# Patient Record
Sex: Male | Born: 1990
Health system: Southern US, Community
[De-identification: ages and names within clinical notes are randomized; demographics above are authoritative.]

## PROBLEM LIST (undated history)

## (undated) DIAGNOSIS — R519 Headache, unspecified: Secondary | ICD-10-CM

## (undated) DIAGNOSIS — I10 Essential (primary) hypertension: Secondary | ICD-10-CM

## (undated) HISTORY — DX: Headache, unspecified: R51.9

---

## 2010-04-16 ENCOUNTER — Emergency Department (HOSPITAL_BASED_OUTPATIENT_CLINIC_OR_DEPARTMENT_OTHER)
Admission: EM | Admit: 2010-04-16 | Discharge: 2010-04-17 | Payer: Self-pay | Source: Home / Self Care | Admitting: Emergency Medicine

## 2010-07-20 LAB — URINALYSIS, ROUTINE W REFLEX MICROSCOPIC
Bilirubin Urine: NEGATIVE
Glucose, UA: NEGATIVE mg/dL
Ketones, ur: NEGATIVE mg/dL
Nitrite: NEGATIVE
Protein, ur: NEGATIVE mg/dL
Specific Gravity, Urine: 1.02 (ref 1.005–1.030)
Urobilinogen, UA: 1 mg/dL (ref 0.0–1.0)
pH: 6.5 (ref 5.0–8.0)

## 2010-07-20 LAB — URINE MICROSCOPIC-ADD ON

## 2010-07-20 LAB — GC/CHLAMYDIA PROBE AMP, GENITAL
Chlamydia, DNA Probe: NEGATIVE
GC Probe Amp, Genital: POSITIVE — AB

## 2010-12-12 ENCOUNTER — Emergency Department (HOSPITAL_BASED_OUTPATIENT_CLINIC_OR_DEPARTMENT_OTHER)
Admission: EM | Admit: 2010-12-12 | Discharge: 2010-12-12 | Disposition: A | Discharge status: Home / Self Care | Payer: Medicaid Other | Attending: Emergency Medicine | Admitting: Emergency Medicine

## 2010-12-12 ENCOUNTER — Encounter: Payer: Self-pay | Admitting: Emergency Medicine

## 2010-12-12 DIAGNOSIS — L259 Unspecified contact dermatitis, unspecified cause: Secondary | ICD-10-CM | POA: Insufficient documentation

## 2010-12-12 DIAGNOSIS — A64 Unspecified sexually transmitted disease: Secondary | ICD-10-CM

## 2010-12-12 MED ORDER — AZITHROMYCIN 1 G PO PACK
1.0000 g | PACK | Freq: Once | ORAL | Status: AC
  Administered 2010-12-12: 1 g via ORAL
  Filled 2010-12-12: qty 1

## 2010-12-12 MED ORDER — CEFTRIAXONE SODIUM 250 MG IJ SOLR
250.0000 mg | Freq: Once | INTRAMUSCULAR | Status: AC
  Administered 2010-12-12: 250 mg via INTRAMUSCULAR
  Filled 2010-12-12: qty 250

## 2010-12-12 MED ORDER — METHYLPREDNISOLONE 4 MG PO KIT: PACK | ORAL | Status: AC

## 2010-12-12 NOTE — ED Provider Notes (Signed)
History     CSN: [id number] Arrival date & time: 12/12/2010  4:11 PM  Chief Complaint  Patient presents with  . Allergic Reaction  . Exposure to STD   Patient is a 20 y.o. male presenting with STD exposure and rash.  Exposure to STD This is a recurrent problem. Pertinent negatives include no chest pain, no abdominal pain, no headaches and no shortness of breath.  Rash  This is a recurrent problem. The current episode started more than 1 week ago. The problem is associated with nothing. There has been no fever. The rash is present on the left hand, right hand, right foot and left foot. The pain is mild. The pain has been constant since onset. Associated symptoms include blisters and itching. Pertinent negatives include no weeping. He has tried steriods for the symptoms. The treatment provided moderate relief.  patient has a rash on the back of his hands and fingers. It is also on the tops of his feet. He states that it has gotten better with hydrocortiosone. He states that he works with latex gloves on. He thinks that it could be from his gonnorhea that he didn't take his abx for.  Patient states that he was previously treated for an STD, but didn't take the medicines. No penile discharge or burning.  History reviewed. No pertinent past medical history.  History reviewed. No pertinent past surgical history.  No family history on file.  History  Substance Use Topics  . Smoking status: Not on file  . Smokeless tobacco: Not on file  . Alcohol Use: Not on file      Review of Systems  Constitutional: Negative for activity change and appetite change.  HENT: Negative for neck stiffness.   Eyes: Negative for pain.  Respiratory: Negative for chest tightness and shortness of breath.   Cardiovascular: Negative for chest pain and leg swelling.  Gastrointestinal: Negative for nausea, vomiting, abdominal pain and diarrhea.  Genitourinary: Negative for hematuria, flank pain, discharge, genital  sores and penile pain.  Musculoskeletal: Negative for back pain.  Skin: Positive for color change, itching and rash. Negative for wound.  Neurological: Negative for weakness, numbness and headaches.  Psychiatric/Behavioral: Negative for behavioral problems.    Physical Exam  BP 135/76  Pulse 83  Temp(Src) 98.4 F (36.9 C) (Oral)  Resp 16  SpO2 100%  Physical Exam  Nursing note and vitals reviewed. Constitutional: He is oriented to person, place, and time. He appears well-developed and well-nourished.  HENT:  Head: Normocephalic and atraumatic.  Eyes: EOM are normal. Pupils are equal, round, and reactive to light.  Neck: Normal range of motion. Neck supple.  Cardiovascular: Normal rate, regular rhythm and normal heart sounds.   No murmur heard. Pulmonary/Chest: Effort normal and breath sounds normal.  Abdominal: Soft. Bowel sounds are normal. He exhibits no distension and no mass. There is no tenderness. There is no rebound and no guarding.  Genitourinary: Penis normal. No penile tenderness.  Musculoskeletal: Normal range of motion. He exhibits no edema.  Neurological: He is alert and oriented to person, place, and time. No cranial nerve deficit.  Skin:       Raised thickened skin on dorsum of bilateral hands, involving fingers. Some blisters. Few areas on feet also  Psychiatric: He has a normal mood and affect.    ED Course  Procedures  MDM Rash to hands. Likely contact dermatitis. Also has STD that he states he didn't take the abx for. reswabbed and will retreat.  Juliet Rude. Rubin Payor, MD 12/12/10 1659

## 2010-12-12 NOTE — ED Notes (Signed)
Pt c/o skin irritation to hands- has recently started working at [employer] & wears latex gloves; also reports he was dx with chlamydia 4 mos ago, but never took rx

## 2010-12-12 NOTE — ED Notes (Signed)
No PCP 

## 2010-12-13 LAB — RPR: RPR Ser Ql: NONREACTIVE

## 2010-12-25 ENCOUNTER — Encounter (HOSPITAL_BASED_OUTPATIENT_CLINIC_OR_DEPARTMENT_OTHER): Payer: Self-pay | Admitting: Emergency Medicine

## 2010-12-25 ENCOUNTER — Emergency Department (HOSPITAL_BASED_OUTPATIENT_CLINIC_OR_DEPARTMENT_OTHER)
Admission: EM | Admit: 2010-12-25 | Discharge: 2010-12-25 | Disposition: A | Discharge status: Home / Self Care | Payer: Self-pay | Attending: Emergency Medicine | Admitting: Emergency Medicine

## 2010-12-25 DIAGNOSIS — L259 Unspecified contact dermatitis, unspecified cause: Secondary | ICD-10-CM | POA: Insufficient documentation

## 2010-12-25 DIAGNOSIS — R209 Unspecified disturbances of skin sensation: Secondary | ICD-10-CM | POA: Insufficient documentation

## 2010-12-25 MED ORDER — PREDNISONE 10 MG PO TABS: 20.0000 mg | ORAL_TABLET | Freq: Every day | ORAL | Status: AC

## 2010-12-25 MED ORDER — SULFAMETHOXAZOLE-TRIMETHOPRIM 800-160 MG PO TABS: 1.0000 | ORAL_TABLET | Freq: Two times a day (BID) | ORAL | Status: AC

## 2010-12-25 MED ORDER — PREDNISONE 20 MG PO TABS
60.0000 mg | ORAL_TABLET | Freq: Once | ORAL | Status: AC
  Administered 2010-12-25: 60 mg via ORAL
  Filled 2010-12-25: qty 3

## 2010-12-25 NOTE — ED Provider Notes (Signed)
History     CSN: [id number] Arrival date & time: 12/25/2010 12:33 PM  Chief Complaint  Patient presents with  . Rash   Patient is a 20 y.o. male presenting with rash. The history is provided by the patient.  Rash  This is a recurrent problem. The current episode started more than 1 week ago. The problem has been gradually worsening. The problem is associated with nothing. There has been no fever. The rash is present on the left hand and right hand. The pain is at a severity of 2/10. The patient is experiencing no pain. Associated symptoms include itching. He has tried nothing for the symptoms. Risk factors include new medications.    History reviewed. No pertinent past medical history.  History reviewed. No pertinent past surgical history.  No family history on file.  History  Substance Use Topics  . Smoking status: Never Smoker   . Smokeless tobacco: Not on file  . Alcohol Use: No      Review of Systems  Skin: Positive for itching and rash.  All other systems reviewed and are negative.    Physical Exam  BP 127/74  Pulse 93  Temp(Src) 98.3 F (36.8 C) (Oral)  Resp 16  SpO2 97%  Physical Exam  Vitals reviewed. Constitutional: He appears well-developed.  HENT:  Head: Normocephalic.  Eyes: Pupils are equal, round, and reactive to light.  Neck: Normal range of motion.  Cardiovascular: Normal rate.   Pulmonary/Chest: Effort normal.  Skin: Rash noted.       Bilateral hand redness with pustules between finger with some pustules    ED Course  Procedures  MDM       Hilario Quarry, MD 12/26/10 [phone]

## 2010-12-25 NOTE — ED Notes (Signed)
Pt c/o rash to hands; reports problem x 5-6 months; sts the rash cleared after his last visit here, but returned when medication was completed.

## 2011-03-10 ENCOUNTER — Emergency Department (INDEPENDENT_AMBULATORY_CARE_PROVIDER_SITE_OTHER): Payer: Medicaid Other

## 2011-03-10 ENCOUNTER — Emergency Department (HOSPITAL_BASED_OUTPATIENT_CLINIC_OR_DEPARTMENT_OTHER)
Admission: EM | Admit: 2011-03-10 | Discharge: 2011-03-10 | Disposition: A | Discharge status: Home / Self Care | Payer: Medicaid Other | Attending: Emergency Medicine | Admitting: Emergency Medicine

## 2011-03-10 ENCOUNTER — Encounter (HOSPITAL_BASED_OUTPATIENT_CLINIC_OR_DEPARTMENT_OTHER): Payer: Self-pay

## 2011-03-10 DIAGNOSIS — S62306A Unspecified fracture of fifth metacarpal bone, right hand, initial encounter for closed fracture: Secondary | ICD-10-CM

## 2011-03-10 DIAGNOSIS — S62309A Unspecified fracture of unspecified metacarpal bone, initial encounter for closed fracture: Secondary | ICD-10-CM | POA: Insufficient documentation

## 2011-03-10 DIAGNOSIS — X838XXA Intentional self-harm by other specified means, initial encounter: Secondary | ICD-10-CM | POA: Insufficient documentation

## 2011-03-10 DIAGNOSIS — S62339A Displaced fracture of neck of unspecified metacarpal bone, initial encounter for closed fracture: Secondary | ICD-10-CM

## 2011-03-10 DIAGNOSIS — M25549 Pain in joints of unspecified hand: Secondary | ICD-10-CM

## 2011-03-10 DIAGNOSIS — IMO0002 Reserved for concepts with insufficient information to code with codable children: Secondary | ICD-10-CM

## 2011-03-10 MED ORDER — HYDROCODONE-ACETAMINOPHEN 5-500 MG PO TABS: 1.0000 | ORAL_TABLET | Freq: Four times a day (QID) | ORAL | Status: AC | PRN

## 2011-03-10 NOTE — ED Provider Notes (Signed)
Medical screening examination/treatment/procedure(s) were performed by non-physician practitioner and as supervising physician I was immediately available for consultation/collaboration.   Shelda Jakes, MD 03/10/11 [phone]

## 2011-03-10 NOTE — ED Notes (Signed)
Punched a wall yesterday-swelling to right hand

## 2011-03-10 NOTE — ED Provider Notes (Signed)
History     CSN: [id number] Arrival date & time: 03/10/2011  1:01 PM   None     Chief Complaint  Patient presents with  . Hand Injury    (Consider location/radiation/quality/duration/timing/severity/associated sxs/prior treatment) HPI Comments: Pt states that he punched the wall and now he has abrasion and swelling to the right hand  Patient is a 20 y.o. male presenting with hand injury. The history is provided by the patient. No language interpreter was used.  Hand Injury  The incident occurred yesterday. The incident occurred at home. The injury mechanism was a direct blow. The pain is present in the right hand. The quality of the pain is described as aching. The pain is moderate. The pain has been constant since the incident. He reports no foreign bodies present. The symptoms are aggravated by movement, palpation and use. He has tried nothing for the symptoms.    History reviewed. No pertinent past medical history.  History reviewed. No pertinent past surgical history.  No family history on file.  History  Substance Use Topics  . Smoking status: Never Smoker   . Smokeless tobacco: Not on file  . Alcohol Use: No      Review of Systems  All other systems reviewed and are negative.    Allergies  Review of patient's allergies indicates no known allergies.  Home Medications   Current Outpatient Rx  Name Route Sig Dispense Refill  . HYDROCORTISONE 1 % EX CREA Topical Apply topically as needed. Red dry cracked skin       BP 133/73  Pulse 74  Temp 98.4 F (36.9 C)  Resp 16  Ht 6\' 1"  (1.854 m)  Wt 250 lb (113.399 kg)  BMI 32.98 kg/m2  SpO2 100%  Physical Exam  Nursing note and vitals reviewed. Constitutional: He is oriented to person, place, and time. He appears well-developed and well-nourished.  HENT:  Head: Normocephalic and atraumatic.  Cardiovascular: Normal rate and regular rhythm.   Pulmonary/Chest: Effort normal and breath sounds normal.    Musculoskeletal:       Pt has obvious swelling noted to the right hand with tenderness along the fourth and fifth metacarpal  Neurological: He is alert and oriented to person, place, and time.       Pulses intact:no neuro deficits  Skin:       Pt has multiple abrasions to the right hand    ED Course  Procedures (including critical care time)  Labs Reviewed - No data to display Dg Hand Complete Right  03/10/2011  *RADIOLOGY REPORT*  Clinical Data: Hit a wall yesterday with pain and swelling  RIGHT HAND - COMPLETE 3+ VIEW  Comparison: None.  Findings: There is an acute slightly angulated fracture of the neck of the right fifth metacarpal with adjacent soft tissue swelling. No other acute abnormality is noted.  The radiocarpal joint space appears normal and the carpal bones are in normal position.  IMPRESSION: Acute slightly angulated fracture of the right fifth metacarpal neck.  Original Report Authenticated By: Juline Patch, M.D.     1. Fracture of fifth metacarpal bone of right hand       MDM  Pt splinted by nursing staff:pt to follow up with ortho:will treat with something for pain        Teressa Lower, NP 03/10/11 1351

## 2012-06-13 ENCOUNTER — Encounter (HOSPITAL_BASED_OUTPATIENT_CLINIC_OR_DEPARTMENT_OTHER): Payer: Self-pay | Admitting: Emergency Medicine

## 2012-06-13 ENCOUNTER — Emergency Department (HOSPITAL_BASED_OUTPATIENT_CLINIC_OR_DEPARTMENT_OTHER)
Admission: EM | Admit: 2012-06-13 | Discharge: 2012-06-13 | Disposition: A | Discharge status: Home / Self Care | Payer: 59 | Attending: Emergency Medicine | Admitting: Emergency Medicine

## 2012-06-13 ENCOUNTER — Emergency Department (HOSPITAL_BASED_OUTPATIENT_CLINIC_OR_DEPARTMENT_OTHER): Payer: 59

## 2012-06-13 DIAGNOSIS — J328 Other chronic sinusitis: Secondary | ICD-10-CM | POA: Insufficient documentation

## 2012-06-13 DIAGNOSIS — G44009 Cluster headache syndrome, unspecified, not intractable: Secondary | ICD-10-CM | POA: Insufficient documentation

## 2012-06-13 DIAGNOSIS — J324 Chronic pansinusitis: Secondary | ICD-10-CM

## 2012-06-13 MED ORDER — HYDROCODONE-ACETAMINOPHEN 5-325 MG PO TABS: 1.0000 | ORAL_TABLET | Freq: Four times a day (QID) | ORAL | Status: DC | PRN

## 2012-06-13 MED ORDER — AZITHROMYCIN 500 MG PO TABS: 500.0000 mg | ORAL_TABLET | Freq: Every day | ORAL | Status: DC

## 2012-06-13 NOTE — ED Notes (Signed)
Patient back from CT.

## 2012-06-13 NOTE — ED Notes (Signed)
Pt placed on 100% NRB per Mds request. Sats 100%

## 2012-06-13 NOTE — ED Notes (Signed)
Patient reports sudden onset of HA while at work approx 2030. Denies hx HAs/migraines. Denies N/V/D, fever, head injury or other s/sx.

## 2012-06-13 NOTE — ED Provider Notes (Addendum)
History     CSN: [id number]  Arrival date & time 06/13/12  0220   First MD Initiated Contact with Patient 06/13/12 0231      Chief Complaint  Patient presents with  . Headache    (Consider location/radiation/quality/duration/timing/severity/associated sxs/prior treatment) HPI This is a 22 year old male who had the sudden onset of a headache at work yesterday evening about 8:30 PM. The pain is described as sharp and located behind his right eye. It has been persistent with some waxing and waning. He has no history of similar headaches. He denies visual change, photophobia, nausea, vomiting or focal neurologic deficit. There are no mitigating or exacerbating factors.  History reviewed. No pertinent past medical history.  History reviewed. No pertinent past surgical history.  No family history on file.  History  Substance Use Topics  . Smoking status: Never Smoker   . Smokeless tobacco: Not on file  . Alcohol Use: No      Review of Systems  All other systems reviewed and are negative.    Allergies  Review of patient's allergies indicates no known allergies.  Home Medications   Current Outpatient Rx  Name  Route  Sig  Dispense  Refill  . HYDROCORTISONE 1 % EX CREA   Topical   Apply topically as needed. Red dry cracked skin            BP 148/115  Pulse 100  Temp 98.3 F (36.8 C) (Oral)  Resp 20  Ht 6' (1.829 m)  Wt 250 lb (113.399 kg)  BMI 33.91 kg/m2  SpO2 98%  Physical Exam General: Well-developed, well-nourished male in no acute distress; appearance consistent with age of record HENT: normocephalic, atraumatic Eyes: pupils equal round and reactive to light; extraocular muscles intact; tearing of right eye Neck: supple Heart: regular rate and rhythm Lungs: clear to auscultation bilaterally Abdomen: soft; nondistended Extremities: No deformity; full range of motion; pulses normal Neurologic: Awake, alert and oriented; motor function intact in all  extremities and symmetric; no facial droop; normal coordination speech; negative Romberg; normal finger to nose Skin: Warm and dry     ED Course  Procedures (including critical care time)    MDM  Nursing notes and vitals signs, including pulse oximetry, reviewed.  Summary of this visit's results, reviewed by myself:  Labs:  No results found for this or any previous visit (from the past 24 hour(s)).  Imaging Studies: Ct Head Wo Contrast  06/13/2012  *RADIOLOGY REPORT*  Clinical Data: Severe headache.  No injury.  CT HEAD WITHOUT CONTRAST  Technique:  Contiguous axial images were obtained from the base of the skull through the vertex without contrast.  Comparison: None.  Findings: The ventricles and sulci are symmetrical without significant effacement, displacement, or dilatation. No mass effect or midline shift. No abnormal extra-axial fluid collections. The grey-white matter junction is distinct. Basal cisterns are not effaced. No acute intracranial hemorrhage. No depressed skull fractures.  Opacification and mucosal membrane thickening throughout the paranasal sinuses.  Mastoid air cells are not opacified.  IMPRESSION: No acute intracranial abnormalities.  Diffuse opacification of the paranasal sinuses.   Original Report Authenticated By: Burman Nieves, M.D.    3:26 AM Headache relieved after treatment with 100% oxygen by nonrebreather. This reinforces the suspected diagnosis of cluster headache. His pansinusitis seen on CT may have contributed to this. We will treat with azithromycin.        Hanley Seamen, MD 06/13/12 0327  Hanley Seamen, MD 06/13/12 [phone]

## 2012-06-13 NOTE — ED Notes (Signed)
Patient transported to CT via stretcher.

## 2014-08-08 ENCOUNTER — Emergency Department (HOSPITAL_BASED_OUTPATIENT_CLINIC_OR_DEPARTMENT_OTHER)
Admission: EM | Admit: 2014-08-08 | Discharge: 2014-08-08 | Disposition: A | Discharge status: Home / Self Care | Payer: 59 | Attending: Emergency Medicine | Admitting: Emergency Medicine

## 2014-08-08 ENCOUNTER — Encounter (HOSPITAL_BASED_OUTPATIENT_CLINIC_OR_DEPARTMENT_OTHER): Payer: Self-pay | Admitting: Emergency Medicine

## 2014-08-08 DIAGNOSIS — Z792 Long term (current) use of antibiotics: Secondary | ICD-10-CM | POA: Insufficient documentation

## 2014-08-08 DIAGNOSIS — R110 Nausea: Secondary | ICD-10-CM | POA: Diagnosis not present

## 2014-08-08 DIAGNOSIS — R51 Headache: Secondary | ICD-10-CM | POA: Insufficient documentation

## 2014-08-08 DIAGNOSIS — R519 Headache, unspecified: Secondary | ICD-10-CM

## 2014-08-08 DIAGNOSIS — Z7952 Long term (current) use of systemic steroids: Secondary | ICD-10-CM | POA: Insufficient documentation

## 2014-08-08 MED ORDER — BUTALBITAL-APAP-CAFFEINE 50-325-40 MG PO TABS: 1.0000 | ORAL_TABLET | Freq: Three times a day (TID) | ORAL | Status: AC | PRN

## 2014-08-08 NOTE — Discharge Instructions (Signed)

## 2014-08-08 NOTE — ED Provider Notes (Signed)
CSN: [id number]     Arrival date & time 08/08/14  0502 History   First MD Initiated Contact with Patient 08/08/14 0515     Chief Complaint  Patient presents with  . Headache     (Consider location/radiation/quality/duration/timing/severity/associated sxs/prior Treatment) HPI Comments: Patient presents with 2 weeks of intermittent frontal headaches aggravated by light.  No alleviating symptoms.  Occasionally has had nausea and had diarrhea this morning.  He has not tried anything at home for these symptoms.  He has had no fevers, chills, coughing, neck pain or stiffness, numbness, weakness, visual changes, recent injuries, or sick contacts.    Patient is a 24 y.o. male presenting with headaches.  Headache Associated symptoms: nausea   Associated symptoms: no abdominal pain, no back pain, no congestion, no cough, no diarrhea, no dizziness, no eye pain, no fatigue, no fever, no numbness, no photophobia, no vomiting and no weakness     History reviewed. No pertinent past medical history. History reviewed. No pertinent past surgical history. No family history on file. History  Substance Use Topics  . Smoking status: Never Smoker   . Smokeless tobacco: Not on file  . Alcohol Use: No    Review of Systems  Constitutional: Negative for fever, activity change, appetite change and fatigue.  HENT: Negative for congestion, facial swelling, rhinorrhea and trouble swallowing.   Eyes: Negative for photophobia and pain.  Respiratory: Negative for cough, chest tightness and shortness of breath.   Cardiovascular: Negative for chest pain and leg swelling.  Gastrointestinal: Positive for nausea. Negative for vomiting, abdominal pain, diarrhea and constipation.  Endocrine: Negative for polydipsia and polyuria.  Genitourinary: Negative for dysuria, urgency, decreased urine volume and difficulty urinating.  Musculoskeletal: Negative for back pain and gait problem.  Skin: Negative for color change, rash  and wound.  Allergic/Immunologic: Negative for immunocompromised state.  Neurological: Positive for headaches. Negative for dizziness, facial asymmetry, speech difficulty, weakness and numbness.  Psychiatric/Behavioral: Negative for confusion, decreased concentration and agitation.      Allergies  Review of patient's allergies indicates no known allergies.  Home Medications   Prior to Admission medications   Medication Sig Start Date End Date Taking? Authorizing Provider  azithromycin (ZITHROMAX) 500 MG tablet Take 1 tablet (500 mg total) by mouth daily. 06/13/12   Paula Libra, MD  butalbital-acetaminophen-caffeine (FIORICET) 50-325-40 MG per tablet Take 1-2 tablets by mouth every 8 (eight) hours as needed for headache. 08/08/14 08/08/15  Toy Cookey, MD  HYDROcodone-acetaminophen (NORCO/VICODIN) 5-325 MG per tablet Take 1-2 tablets by mouth every 6 (six) hours as needed for pain. 06/13/12   John Molpus, MD  hydrocortisone 1 % cream Apply topically as needed. Red dry cracked skin     Historical Provider, MD   BP 139/93 mmHg  Pulse 93  Temp(Src) 98.3 F (36.8 C) (Oral)  Resp 16  Ht 6' (1.829 m)  Wt 250 lb (113.399 kg)  BMI 33.90 kg/m2  SpO2 97% Physical Exam  Constitutional: He is oriented to person, place, and time. He appears well-developed and well-nourished. No distress.  HENT:  Head: Normocephalic and atraumatic.  Mouth/Throat: No oropharyngeal exudate.  Eyes: Pupils are equal, round, and reactive to light.  Neck: Normal range of motion. Neck supple.  Cardiovascular: Normal rate, regular rhythm and normal heart sounds.  Exam reveals no gallop and no friction rub.   No murmur heard. Pulmonary/Chest: Effort normal and breath sounds normal. No respiratory distress. He has no wheezes. He has no rales.  Abdominal: Soft. Bowel  sounds are normal. He exhibits no distension and no mass. There is no tenderness. There is no rebound and no guarding.  Musculoskeletal: Normal range of  motion. He exhibits no edema or tenderness.  Neurological: He is alert and oriented to person, place, and time. He has normal strength. He displays no atrophy and no tremor. No cranial nerve deficit or sensory deficit. He exhibits normal muscle tone. He displays a negative Romberg sign. Coordination and gait normal. GCS eye subscore is 4. GCS verbal subscore is 5. GCS motor subscore is 6.  Skin: Skin is warm and dry.  Psychiatric: He has a normal mood and affect.    ED Course  Procedures (including critical care time) Labs Review Labs Reviewed - No data to display  Imaging Review No results found.   EKG Interpretation None      MDM   Final diagnoses:  Frequent headaches    Pt is a [age] y.o. male with Pmhx as above who presents with about 2 weeks of intermittent frontal headaches aggravated by light.  No alleviating symptoms.  Occasionally has had nausea and had diarrhea this morning.  He has not tried anything at home for these symptoms.  He has had no fevers, chills, coughing, neck pain or stiffness, numbness, weakness, visual changes, recent injuries, or sick contacts.  Physical exam vital signs are stable.  Patient is in no acute distress.  Headache is currently 0 out of 10.  He has no focal neurologic findings.  Patient was seen in February 2016 for what was diagnosed as a cluster headache and at that time had a CT scan of his head which was normal except for pansinusitis, symptoms today, not consistent with cluster headache, I do not feel that he requires repeat neuroimaging.  I've given him follow-up information to establish with a local PCP and Fioricet trial for headaches.     Schuyler Amorimothy Leap evaluation in the Emergency Department is complete. It has been determined that no acute conditions requiring further emergency intervention are present at this time. The patient/guardian have been advised of the diagnosis and plan. We have discussed signs and symptoms that warrant return to  the ED, such as changes or worsening in symptoms, worsening pain, fever, numbness, weakness, confusion.      Toy CookeyMegan Docherty, MD 08/08/14 [phone]

## 2014-08-08 NOTE — ED Notes (Addendum)
Headaches on and off for 1 week with nausea and diarrhea. Sensitivity to light. Denies Pain at the moment.

## 2014-09-28 ENCOUNTER — Emergency Department (HOSPITAL_BASED_OUTPATIENT_CLINIC_OR_DEPARTMENT_OTHER): Payer: 59

## 2014-09-28 ENCOUNTER — Encounter (HOSPITAL_BASED_OUTPATIENT_CLINIC_OR_DEPARTMENT_OTHER): Payer: Self-pay | Admitting: *Deleted

## 2014-09-28 ENCOUNTER — Emergency Department (HOSPITAL_BASED_OUTPATIENT_CLINIC_OR_DEPARTMENT_OTHER)
Admission: EM | Admit: 2014-09-28 | Discharge: 2014-09-28 | Disposition: A | Discharge status: Home / Self Care | Payer: 59 | Attending: Emergency Medicine | Admitting: Emergency Medicine

## 2014-09-28 DIAGNOSIS — Z79899 Other long term (current) drug therapy: Secondary | ICD-10-CM | POA: Insufficient documentation

## 2014-09-28 DIAGNOSIS — Z202 Contact with and (suspected) exposure to infections with a predominantly sexual mode of transmission: Secondary | ICD-10-CM | POA: Diagnosis present

## 2014-09-28 DIAGNOSIS — R0600 Dyspnea, unspecified: Secondary | ICD-10-CM | POA: Diagnosis not present

## 2014-09-28 DIAGNOSIS — Z792 Long term (current) use of antibiotics: Secondary | ICD-10-CM | POA: Diagnosis not present

## 2014-09-28 LAB — URINALYSIS, ROUTINE W REFLEX MICROSCOPIC
BILIRUBIN URINE: NEGATIVE
Glucose, UA: NEGATIVE mg/dL
HGB URINE DIPSTICK: NEGATIVE
KETONES UR: NEGATIVE mg/dL
Leukocytes, UA: NEGATIVE
NITRITE: NEGATIVE
PROTEIN: NEGATIVE mg/dL
Specific Gravity, Urine: 1.02 (ref 1.005–1.030)
UROBILINOGEN UA: 1 mg/dL (ref 0.0–1.0)
pH: 6.5 (ref 5.0–8.0)

## 2014-09-28 NOTE — ED Notes (Signed)
Pt ambulatory to xray, steady gait.  

## 2014-09-28 NOTE — ED Notes (Addendum)
Pt c/o dry  cough for past week and feeling sob. Denies any fevers. Denies any recent long car rides. Denies any chest pain. Also states he is here for his "regular STD check up" denies any urinary symptoms.

## 2014-09-28 NOTE — ED Notes (Signed)
Admits to sob and cough (denies: pain, nvd, fever, dizziness, congestion, dysuria, d/c or other sx), alert, NAD, calm, interactive, no dyspnea noted.

## 2014-09-28 NOTE — ED Provider Notes (Signed)
CSN: [id number]     Arrival date & time 09/28/14  0414 History   First MD Initiated Contact with Patient 09/28/14 0451     Chief Complaint  Patient presents with  . STD check and Sob      (Consider location/radiation/quality/duration/timing/severity/associated sxs/prior Treatment) HPI Comments: Patient is a 24 year old male who presents with complaints of cough and feeling short of breath for the past week. He denies any fevers or chills. He denies any chest pain. He denies any productive cough. Is also requesting an STD check. He denies any symptoms but states that he has had unprotected sex with a new partner. He has had an STD in the past.  The history is provided by the patient.    History reviewed. No pertinent past medical history. History reviewed. No pertinent past surgical history. No family history on file. History  Substance Use Topics  . Smoking status: Never Smoker   . Smokeless tobacco: Not on file  . Alcohol Use: No    Review of Systems  All other systems reviewed and are negative.     Allergies  Review of patient's allergies indicates no known allergies.  Home Medications   Prior to Admission medications   Medication Sig Start Date End Date Taking? Authorizing Provider  azithromycin (ZITHROMAX) 500 MG tablet Take 1 tablet (500 mg total) by mouth daily. 06/13/12   Paula LibraJohn Molpus, MD  butalbital-acetaminophen-caffeine (FIORICET) 50-325-40 MG per tablet Take 1-2 tablets by mouth every 8 (eight) hours as needed for headache. 08/08/14 08/08/15  Toy CookeyMegan Docherty, MD  HYDROcodone-acetaminophen (NORCO/VICODIN) 5-325 MG per tablet Take 1-2 tablets by mouth every 6 (six) hours as needed for pain. 06/13/12   John Molpus, MD  hydrocortisone 1 % cream Apply topically as needed. Red dry cracked skin     Historical Provider, MD   BP 143/88 mmHg  Pulse 74  Temp(Src) 98.8 F (37.1 C) (Oral)  Resp 20  SpO2 97% Physical Exam  Constitutional: He is oriented to person, place, and  time. He appears well-developed and well-nourished. No distress.  HENT:  Head: Normocephalic and atraumatic.  Neck: Normal range of motion. Neck supple.  Cardiovascular: Normal rate, regular rhythm and normal heart sounds.   No murmur heard. Pulmonary/Chest: Effort normal and breath sounds normal. No respiratory distress. He has no wheezes. He has no rales.  Abdominal: Soft. Bowel sounds are normal. He exhibits no distension. There is no tenderness.  Genitourinary: Penis normal. No penile tenderness.  There are no penile lesions and no urethral discharge.  Musculoskeletal: Normal range of motion. He exhibits no edema.  Neurological: He is alert and oriented to person, place, and time.  Skin: Skin is warm and dry. He is not diaphoretic.  Nursing note and vitals reviewed.   ED Course  Procedures (including critical care time) Labs Review Labs Reviewed  URINALYSIS, ROUTINE W REFLEX MICROSCOPIC  GC/CHLAMYDIA PROBE AMP ()    Imaging Review No results found.   EKG Interpretation None      MDM   Final diagnoses:  None    X-ray shows no acute abnormalities. His respiratory rate and oxygen saturations are very normal. Urine is clear and his chlamydia and gonorrhea tests are pending.    Geoffery Lyonsouglas Arnett Duddy, MD 09/28/14 [phone]

## 2014-09-28 NOTE — Discharge Instructions (Signed)
We will call you if your cultures indicate you require further treatment.   Shortness of Breath Shortness of breath means you have trouble breathing. It could also mean that you have a medical problem. You should get immediate medical care for shortness of breath. CAUSES   Not enough oxygen in the air such as with high altitudes or a smoke-filled room.  Certain lung diseases, infections, or problems.  Heart disease or conditions, such as angina or heart failure.  Low red blood cells (anemia).  Poor physical fitness, which can cause shortness of breath when you exercise.  Chest or back injuries or stiffness.  Being overweight.  Smoking.  Anxiety, which can make you feel like you are not getting enough air. DIAGNOSIS  Serious medical problems can often be found during your physical exam. Tests may also be done to determine why you are having shortness of breath. Tests may include:  Chest X-rays.  Lung function tests.  Blood tests.  An electrocardiogram (ECG).  An ambulatory electrocardiogram. An ambulatory ECG records your heartbeat patterns over a 24-hour period.  Exercise testing.  A transthoracic echocardiogram (TTE). During echocardiography, sound waves are used to evaluate how blood flows through your heart.  A transesophageal echocardiogram (TEE).  Imaging scans. Your health care provider may not be able to find a cause for your shortness of breath after your exam. In this case, it is important to have a follow-up exam with your health care provider as directed.  TREATMENT  Treatment for shortness of breath depends on the cause of your symptoms and can vary greatly. HOME CARE INSTRUCTIONS   Do not smoke. Smoking is a common cause of shortness of breath. If you smoke, ask for help to quit.  Avoid being around chemicals or things that may bother your breathing, such as paint fumes and dust.  Rest as needed. Slowly resume your usual activities.  If medicines  were prescribed, take them as directed for the full length of time directed. This includes oxygen and any inhaled medicines.  Keep all follow-up appointments as directed by your health care provider. SEEK MEDICAL CARE IF:   Your condition does not improve in the time expected.  You have a hard time doing your normal activities even with rest.  You have any new symptoms. SEEK IMMEDIATE MEDICAL CARE IF:   Your shortness of breath gets worse.  You feel light-headed, faint, or develop a cough not controlled with medicines.  You start coughing up blood.  You have pain with breathing.  You have chest pain or pain in your arms, shoulders, or abdomen.  You have a fever.  You are unable to walk up stairs or exercise the way you normally do. MAKE SURE YOU:  Understand these instructions.  Will watch your condition.  Will get help right away if you are not doing well or get worse. Document Released: 01/18/2001 Document Revised: 04/30/2013 Document Reviewed: 07/11/2011 [facility] Patient Information 2015 [place], [place]. This information is not intended to replace advice given to you by your health care provider. Make sure you discuss any questions you have with your health care provider.

## 2014-09-30 LAB — GC/CHLAMYDIA PROBE AMP (~~LOC~~) NOT AT ARMC
Chlamydia: NEGATIVE
NEISSERIA GONORRHEA: NEGATIVE

## 2014-10-23 ENCOUNTER — Emergency Department (HOSPITAL_BASED_OUTPATIENT_CLINIC_OR_DEPARTMENT_OTHER)
Admission: EM | Admit: 2014-10-23 | Discharge: 2014-10-23 | Disposition: A | Discharge status: Home / Self Care | Payer: 59 | Attending: Emergency Medicine | Admitting: Emergency Medicine

## 2014-10-23 ENCOUNTER — Encounter (HOSPITAL_BASED_OUTPATIENT_CLINIC_OR_DEPARTMENT_OTHER): Payer: Self-pay | Admitting: Emergency Medicine

## 2014-10-23 DIAGNOSIS — R509 Fever, unspecified: Secondary | ICD-10-CM | POA: Insufficient documentation

## 2014-10-23 DIAGNOSIS — Z792 Long term (current) use of antibiotics: Secondary | ICD-10-CM | POA: Insufficient documentation

## 2014-10-23 DIAGNOSIS — M791 Myalgia, unspecified site: Secondary | ICD-10-CM

## 2014-10-23 MED ORDER — METHOCARBAMOL 500 MG PO TABS
1000.0000 mg | ORAL_TABLET | Freq: Once | ORAL | Status: AC
  Administered 2014-10-23: 1000 mg via ORAL
  Filled 2014-10-23: qty 2

## 2014-10-23 MED ORDER — NAPROXEN 375 MG PO TABS: 375.0000 mg | ORAL_TABLET | Freq: Two times a day (BID) | ORAL | Status: DC

## 2014-10-23 MED ORDER — METHOCARBAMOL 500 MG PO TABS: 500.0000 mg | ORAL_TABLET | Freq: Two times a day (BID) | ORAL | Status: DC

## 2014-10-23 MED ORDER — NAPROXEN 250 MG PO TABS
500.0000 mg | ORAL_TABLET | Freq: Once | ORAL | Status: AC
  Administered 2014-10-23: 500 mg via ORAL
  Filled 2014-10-23: qty 2

## 2014-10-23 NOTE — ED Notes (Signed)
Patient states that he is having muscle spasms in shoulders, legs and back starting on Monday

## 2014-10-23 NOTE — ED Provider Notes (Signed)
CSN: [id number]     Arrival date & time 10/23/14  0310 History   First MD Initiated Contact with Patient 10/23/14 0354     Chief Complaint  Patient presents with  . Muscle Pain     (Consider location/radiation/quality/duration/timing/severity/associated sxs/prior Treatment) Patient is a 24 y.o. male presenting with musculoskeletal pain. The history is provided by the patient.  Muscle Pain This is a new problem. The current episode started more than 2 days ago. The problem occurs constantly. The problem has not changed since onset.Pertinent negatives include no chest pain, no abdominal pain, no headaches and no shortness of breath. Nothing aggravates the symptoms. Nothing relieves the symptoms. He has tried nothing for the symptoms. The treatment provided no relief.  does a lot lifting at work and hurts all over.  No weakness or numbness no swelling no fevers no fasciculations.    History reviewed. No pertinent past medical history. History reviewed. No pertinent past surgical history. History reviewed. No pertinent family history. History  Substance Use Topics  . Smoking status: Never Smoker   . Smokeless tobacco: Not on file  . Alcohol Use: No    Review of Systems  Constitutional: Positive for fever.  Respiratory: Negative for shortness of breath.   Cardiovascular: Negative for chest pain.  Gastrointestinal: Negative for abdominal pain.  Musculoskeletal: Positive for myalgias. Negative for arthralgias, neck pain and neck stiffness.  Skin: Negative for rash and wound.  Neurological: Negative for weakness, numbness and headaches.  All other systems reviewed and are negative.     Allergies  Review of patient's allergies indicates no known allergies.  Home Medications   Prior to Admission medications   Medication Sig Start Date End Date Taking? Authorizing Provider  azithromycin (ZITHROMAX) 500 MG tablet Take 1 tablet (500 mg total) by mouth daily. 06/13/12   Paula Libra, MD   butalbital-acetaminophen-caffeine (FIORICET) 50-325-40 MG per tablet Take 1-2 tablets by mouth every 8 (eight) hours as needed for headache. 08/08/14 08/08/15  Toy Cookey, MD  HYDROcodone-acetaminophen (NORCO/VICODIN) 5-325 MG per tablet Take 1-2 tablets by mouth every 6 (six) hours as needed for pain. 06/13/12   John Molpus, MD  hydrocortisone 1 % cream Apply topically as needed. Red dry cracked skin     Historical Provider, MD  methocarbamol (ROBAXIN) 500 MG tablet Take 1 tablet (500 mg total) by mouth 2 (two) times daily. 10/23/14   Burel Kahre, MD  naproxen (NAPROSYN) 375 MG tablet Take 1 tablet (375 mg total) by mouth 2 (two) times daily. 10/23/14   Leaner Morici, MD   BP 127/87 mmHg  Pulse 80  Temp(Src) 98.5 F (36.9 C) (Oral)  Resp 20  Ht 6' (1.829 m)  Wt 250 lb (113.399 kg)  BMI 33.90 kg/m2  SpO2 98% Physical Exam  Constitutional: He is oriented to person, place, and time. He appears well-developed and well-nourished. No distress.  Sound asleep in the room upon entrance  HENT:  Head: Normocephalic and atraumatic.  Mouth/Throat: Oropharynx is clear and moist.  Eyes: Conjunctivae and EOM are normal. Pupils are equal, round, and reactive to light.  Neck: Normal range of motion. Neck supple.  Cardiovascular: Normal rate, regular rhythm and intact distal pulses.   Pulmonary/Chest: Effort normal and breath sounds normal. He has no wheezes. He has no rales.  Abdominal: Soft. Bowel sounds are normal. There is no tenderness. There is no rebound and no guarding.  Musculoskeletal: Normal range of motion. He exhibits no edema or tenderness.  Neurological: He is alert  and oriented to person, place, and time. He has normal reflexes.  5/5 throughout all compartments are soft.  Gait is steady  Skin: Skin is warm and dry. No rash noted. No erythema. No pallor.  Psychiatric: He has a normal mood and affect.    ED Course  Procedures (including critical care time) Labs Review Labs Reviewed -  No data to display  Imaging Review No results found.   EKG Interpretation None      MDM   Final diagnoses:  Muscle pain   Well appearing.  No trigger points.  No signs of heat illness.  No indication for labs at this time As patient has tried nothing will prescribe NSAIDs and muscle relaxants and ice thereapy.      Cy Blamer, MD 10/23/14 [phone]

## 2015-04-20 ENCOUNTER — Encounter (HOSPITAL_BASED_OUTPATIENT_CLINIC_OR_DEPARTMENT_OTHER): Payer: Self-pay | Admitting: Emergency Medicine

## 2015-04-20 ENCOUNTER — Emergency Department (HOSPITAL_BASED_OUTPATIENT_CLINIC_OR_DEPARTMENT_OTHER)
Admission: EM | Admit: 2015-04-20 | Discharge: 2015-04-20 | Disposition: A | Discharge status: Home / Self Care | Payer: 59 | Attending: Emergency Medicine | Admitting: Emergency Medicine

## 2015-04-20 DIAGNOSIS — Z79899 Other long term (current) drug therapy: Secondary | ICD-10-CM | POA: Diagnosis not present

## 2015-04-20 DIAGNOSIS — R109 Unspecified abdominal pain: Secondary | ICD-10-CM | POA: Diagnosis present

## 2015-04-20 DIAGNOSIS — E876 Hypokalemia: Secondary | ICD-10-CM

## 2015-04-20 DIAGNOSIS — Z792 Long term (current) use of antibiotics: Secondary | ICD-10-CM | POA: Diagnosis not present

## 2015-04-20 DIAGNOSIS — R197 Diarrhea, unspecified: Secondary | ICD-10-CM

## 2015-04-20 DIAGNOSIS — R1110 Vomiting, unspecified: Secondary | ICD-10-CM

## 2015-04-20 DIAGNOSIS — Z791 Long term (current) use of non-steroidal anti-inflammatories (NSAID): Secondary | ICD-10-CM | POA: Insufficient documentation

## 2015-04-20 LAB — CBC WITH DIFFERENTIAL/PLATELET
BASOS ABS: 0 10*3/uL (ref 0.0–0.1)
Basophils Relative: 0 %
Eosinophils Absolute: 0.1 10*3/uL (ref 0.0–0.7)
Eosinophils Relative: 3 %
HEMATOCRIT: 46 % (ref 39.0–52.0)
HEMOGLOBIN: 15.1 g/dL (ref 13.0–17.0)
LYMPHS PCT: 32 %
Lymphs Abs: 1.8 10*3/uL (ref 0.7–4.0)
MCH: 27.3 pg (ref 26.0–34.0)
MCHC: 32.8 g/dL (ref 30.0–36.0)
MCV: 83 fL (ref 78.0–100.0)
MONO ABS: 0.6 10*3/uL (ref 0.1–1.0)
Monocytes Relative: 11 %
NEUTROS ABS: 3 10*3/uL (ref 1.7–7.7)
Neutrophils Relative %: 54 %
Platelets: 294 10*3/uL (ref 150–400)
RBC: 5.54 MIL/uL (ref 4.22–5.81)
RDW: 13.3 % (ref 11.5–15.5)
WBC: 5.6 10*3/uL (ref 4.0–10.5)

## 2015-04-20 LAB — BASIC METABOLIC PANEL
ANION GAP: 9 (ref 5–15)
BUN: 12 mg/dL (ref 6–20)
CO2: 28 mmol/L (ref 22–32)
Calcium: 9.2 mg/dL (ref 8.9–10.3)
Chloride: 102 mmol/L (ref 101–111)
Creatinine, Ser: 1.15 mg/dL (ref 0.61–1.24)
GFR calc Af Amer: >60 mL/min (ref >60)
GLUCOSE: 96 mg/dL (ref 65–99)
POTASSIUM: 3.3 mmol/L — AB (ref 3.5–5.1)
Sodium: 139 mmol/L (ref 135–145)

## 2015-04-20 MED ORDER — ONDANSETRON 4 MG PO TBDP: 4.0000 mg | ORAL_TABLET | Freq: Three times a day (TID) | ORAL | Status: DC | PRN

## 2015-04-20 NOTE — ED Notes (Signed)
Patient reports that 2 -3 days ago he had epigastric pain with N/V/D - Today he is much better but still having some pain to his right upper abdominal area

## 2015-04-20 NOTE — ED Notes (Signed)
Po fluids provided and tolerated without difficulty, pt requesting work note at d/c

## 2015-04-20 NOTE — ED Provider Notes (Signed)
CSN: [id number]     Arrival date & time 04/20/15  1342 History   First MD Initiated Contact with Patient 04/20/15 1347     Chief Complaint  Patient presents with  . Abdominal Pain     (Consider location/radiation/quality/duration/timing/severity/associated sxs/prior Treatment) HPI Comments: Pt comes in with c/o vomiting,diarrhea and epigastric pain that started 3 days ago. He states that he hasn't had any episodes since 4 this morning. States that all symptoms have resolved and he is tolerating po. No fever.hasn't taken anything for the symptoms.  The history is provided by the patient. No language interpreter was used.    History reviewed. No pertinent past medical history. History reviewed. No pertinent past surgical history. History reviewed. No pertinent family history. Social History  Substance Use Topics  . Smoking status: Never Smoker   . Smokeless tobacco: None  . Alcohol Use: No    Review of Systems  All other systems reviewed and are negative.     Allergies  Review of patient's allergies indicates no known allergies.  Home Medications   Prior to Admission medications   Medication Sig Start Date End Date Taking? Authorizing Provider  azithromycin (ZITHROMAX) 500 MG tablet Take 1 tablet (500 mg total) by mouth daily. 06/13/12   Paula Libra, MD  butalbital-acetaminophen-caffeine (FIORICET) 50-325-40 MG per tablet Take 1-2 tablets by mouth every 8 (eight) hours as needed for headache. 08/08/14 08/08/15  Toy Cookey, MD  HYDROcodone-acetaminophen (NORCO/VICODIN) 5-325 MG per tablet Take 1-2 tablets by mouth every 6 (six) hours as needed for pain. 06/13/12   John Molpus, MD  hydrocortisone 1 % cream Apply topically as needed. Red dry cracked skin     Historical Provider, MD  methocarbamol (ROBAXIN) 500 MG tablet Take 1 tablet (500 mg total) by mouth 2 (two) times daily. 10/23/14   April Palumbo, MD  naproxen (NAPROSYN) 375 MG tablet Take 1 tablet (375 mg total) by mouth 2  (two) times daily. 10/23/14   April Palumbo, MD   BP 126/90 mmHg  Pulse 80  Temp(Src) 98.4 F (36.9 C) (Oral)  Resp 18  Ht  (1.803 m)  Wt 113.399 kg  BMI 34.88 kg/m2  SpO2 98% Physical Exam  Constitutional: He is oriented to person, place, and time. He appears well-developed and well-nourished.  Cardiovascular: Normal rate and regular rhythm.   Pulmonary/Chest: Breath sounds normal.  Abdominal: Soft. Bowel sounds are normal. There is no tenderness.  Musculoskeletal: Normal range of motion.  Neurological: He is alert and oriented to person, place, and time. Coordination normal.  Skin: Skin is warm and dry.  Nursing note and vitals reviewed.   ED Course  Procedures (including critical care time) Labs Review Labs Reviewed  BASIC METABOLIC PANEL - Abnormal; Notable for the following:    Potassium 3.3 (*)    All other components within normal limits  CBC WITH DIFFERENTIAL/PLATELET    Imaging Review No results found. I have personally reviewed and evaluated these images and lab results as part of my medical decision-making.   EKG Interpretation   Date/Time:  Monday April 20 2015 13:45:09 EST Ventricular Rate:  81 PR Interval:  144 QRS Duration: 90 QT Interval:  346 QTC Calculation: 401 R Axis:   32 Text Interpretation:  Normal sinus rhythm Nonspecific T wave abnormality  Abnormal ECG No old tracing to compare Confirmed by CAMPOS  MD, Caryn Bee  ([id number]) on 04/20/2015 1:55:51 PM      MDM   Final diagnoses:  Vomiting and diarrhea  Hypokalemia    Abdomen is benign. Pt is tolerating po without any problem. Vitals are stable. Likely viral process that has resolved. Given zofran for recurrent symptoms    Teressa LowerVrinda Sotero Brinkmeyer, NP 04/20/15 [phone]  Azalia BilisKevin Campos, MD 04/20/15 [phone]

## 2015-04-20 NOTE — Discharge Instructions (Signed)
°Nausea and Vomiting °Nausea means you feel sick to your stomach. Throwing up (vomiting) is a reflex where stomach contents come out of your mouth. °HOME CARE  °· Take medicine as told by your doctor. °· Do not force yourself to eat. However, you do need to drink fluids. °· If you feel like eating, eat a normal diet as told by your doctor. °¨ Eat rice, wheat, potatoes, bread, lean meats, yogurt, fruits, and vegetables. °¨ Avoid high-fat foods. °· Drink enough fluids to keep your pee (urine) clear or pale yellow. °· Ask your doctor how to replace body fluid losses (rehydrate). Signs of body fluid loss (dehydration) include: °¨ Feeling very thirsty. °¨ Dry lips and mouth. °¨ Feeling dizzy. °¨ Dark pee. °¨ Peeing less than normal. °¨ Feeling confused. °¨ Fast breathing or heart rate. °GET HELP RIGHT AWAY IF:  °· You have blood in your throw up. °· You have black or bloody poop (stool). °· You have a bad headache or stiff neck. °· You feel confused. °· You have bad belly (abdominal) pain. °· You have chest pain or trouble breathing. °· You do not pee at least once every 8 hours. °· You have cold, clammy skin. °· You keep throwing up after 24 to 48 hours. °· You have a fever. °MAKE SURE YOU:  °· Understand these instructions. °· Will watch your condition. °· Will get help right away if you are not doing well or get worse. °  °This information is not intended to replace advice given to you by your health care provider. Make sure you discuss any questions you have with your health care provider. °  °Document Released: 10/12/2007 Document Revised: 07/18/2011 Document Reviewed: 09/24/2010 °Elsevier Interactive Patient Education ©2016 Elsevier Inc. °Hypokalemia °Hypokalemia means that the amount of potassium in the blood is lower than normal. Potassium is a chemical, called an electrolyte, that helps regulate the amount of fluid in the body. It also stimulates muscle contraction and helps nerves function properly. Most of  the body's potassium is inside of cells, and only a very small amount is in the blood. Because the amount in the blood is so small, minor changes can be life-threatening. °CAUSES °· Antibiotics. °· Diarrhea or vomiting. °· Using laxatives too much, which can cause diarrhea. °· Chronic kidney disease. °· Water pills (diuretics). °· Eating disorders (bulimia). °· Low magnesium level. °· Sweating a lot. °SIGNS AND SYMPTOMS °· Weakness. °· Constipation. °· Fatigue. °· Muscle cramps. °· Mental confusion. °· Skipped heartbeats or irregular heartbeat (palpitations). °· Tingling or numbness. °DIAGNOSIS  °Your health care provider can diagnose hypokalemia with blood tests. In addition to checking your potassium level, your health care provider may also check other lab tests. °TREATMENT °Hypokalemia can be treated with potassium supplements taken by mouth or adjustments in your current medicines. If your potassium level is very low, you may need to get potassium through a vein (IV) and be monitored in the hospital. A diet high in potassium is also helpful. Foods high in potassium are: °· Nuts, such as peanuts and pistachios. °· Seeds, such as sunflower seeds and pumpkin seeds. °· Peas, lentils, and lima beans. °· Whole grain and bran cereals and breads. °· Fresh fruit and vegetables, such as apricots, avocado, bananas, cantaloupe, kiwi, oranges, tomatoes, asparagus, and potatoes. °· Orange and tomato juices. °· Red meats. °· Fruit yogurt. °HOME CARE INSTRUCTIONS °· Take all medicines as prescribed by your health care provider. °· Maintain a healthy diet by including nutritious food, such   as fruits, vegetables, nuts, whole grains, and lean meats. °· If you are taking a laxative, be sure to follow the directions on the label. °SEEK MEDICAL CARE IF: °· Your weakness gets worse. °· You feel your heart pounding or racing. °· You are vomiting or having diarrhea. °· You are diabetic and having trouble keeping your blood glucose in  the normal range. °SEEK IMMEDIATE MEDICAL CARE IF: °· You have chest pain, shortness of breath, or dizziness. °· You are vomiting or having diarrhea for more than 2 days. °· You faint. °MAKE SURE YOU:  °· Understand these instructions. °· Will watch your condition. °· Will get help right away if you are not doing well or get worse. °  °This information is not intended to replace advice given to you by your health care provider. Make sure you discuss any questions you have with your health care provider. °  °Document Released: 04/25/2005 Document Revised: 05/16/2014 Document Reviewed: 10/26/2012 °Elsevier Interactive Patient Education ©2016 Elsevier Inc. ° °

## 2015-06-28 ENCOUNTER — Encounter (HOSPITAL_BASED_OUTPATIENT_CLINIC_OR_DEPARTMENT_OTHER): Payer: Self-pay | Admitting: *Deleted

## 2015-06-28 ENCOUNTER — Emergency Department (HOSPITAL_BASED_OUTPATIENT_CLINIC_OR_DEPARTMENT_OTHER)
Admission: EM | Admit: 2015-06-28 | Discharge: 2015-06-28 | Disposition: A | Discharge status: Home / Self Care | Payer: 59 | Attending: Emergency Medicine | Admitting: Emergency Medicine

## 2015-06-28 DIAGNOSIS — Z79899 Other long term (current) drug therapy: Secondary | ICD-10-CM | POA: Insufficient documentation

## 2015-06-28 DIAGNOSIS — L298 Other pruritus: Secondary | ICD-10-CM | POA: Diagnosis not present

## 2015-06-28 DIAGNOSIS — J302 Other seasonal allergic rhinitis: Secondary | ICD-10-CM | POA: Insufficient documentation

## 2015-06-28 DIAGNOSIS — Z791 Long term (current) use of non-steroidal anti-inflammatories (NSAID): Secondary | ICD-10-CM | POA: Insufficient documentation

## 2015-06-28 DIAGNOSIS — R0981 Nasal congestion: Secondary | ICD-10-CM | POA: Diagnosis present

## 2015-06-28 DIAGNOSIS — Z792 Long term (current) use of antibiotics: Secondary | ICD-10-CM | POA: Diagnosis not present

## 2015-06-28 MED ORDER — FLUTICASONE PROPIONATE 50 MCG/ACT NA SUSP: 2.0000 | Freq: Every day | NASAL | Status: DC

## 2015-06-28 MED ORDER — FEXOFENADINE-PSEUDOEPHED ER 60-120 MG PO TB12: 1.0000 | ORAL_TABLET | Freq: Two times a day (BID) | ORAL | Status: DC

## 2015-06-28 NOTE — ED Notes (Signed)
Having a lot of congestion, has tried NyQuill and Zyrtec but having no relief. Having a feeling of fullness in head

## 2015-06-28 NOTE — ED Notes (Signed)
MD at bedside. 

## 2015-06-28 NOTE — Discharge Instructions (Signed)
Allergic Rhinitis Allergic rhinitis is when the mucous membranes in the nose respond to allergens. Allergens are particles in the air that cause your body to have an allergic reaction. This causes you to release allergic antibodies. Through a chain of events, these eventually cause you to release histamine into the blood stream. Although meant to protect the body, it is this release of histamine that causes your discomfort, such as frequent sneezing, congestion, and an itchy, runny nose.  CAUSES Seasonal allergic rhinitis (hay fever) is caused by pollen allergens that may come from grasses, trees, and weeds. Year-round allergic rhinitis (perennial allergic rhinitis) is caused by allergens such as house dust mites, pet dander, and mold spores. SYMPTOMS  Nasal stuffiness (congestion).  Itchy, runny nose with sneezing and tearing of the eyes. DIAGNOSIS Your health care provider can help you determine the allergen or allergens that trigger your symptoms. If you and your health care provider are unable to determine the allergen, skin or blood testing may be used. Your health care provider will diagnose your condition after taking your health history and performing a physical exam. Your health care provider may assess you for other related conditions, such as asthma, pink eye, or an ear infection. TREATMENT Allergic rhinitis does not have a cure, but it can be controlled by:  Medicines that block allergy symptoms. These may include allergy shots, nasal sprays, and oral antihistamines.  Avoiding the allergen. Hay fever may often be treated with antihistamines in pill or nasal spray forms. Antihistamines block the effects of histamine. There are over-the-counter medicines that may help with nasal congestion and swelling around the eyes. Check with your health care provider before taking or giving this medicine. If avoiding the allergen or the medicine prescribed do not work, there are many new medicines  your health care provider can prescribe. Stronger medicine may be used if initial measures are ineffective. Desensitizing injections can be used if medicine and avoidance does not work. Desensitization is when a patient is given ongoing shots until the body becomes less sensitive to the allergen. Make sure you follow up with your health care provider if problems continue. HOME CARE INSTRUCTIONS It is not possible to completely avoid allergens, but you can reduce your symptoms by taking steps to limit your exposure to them. It helps to know exactly what you are allergic to so that you can avoid your specific triggers. SEEK MEDICAL CARE IF:  You have a fever.  You develop a cough that does not stop easily (persistent).  You have shortness of breath.  You start wheezing.  Symptoms interfere with normal daily activities.   This information is not intended to replace advice given to you by your health care provider. Make sure you discuss any questions you have with your health care provider.   Document Released: 01/18/2001 Document Revised: 05/16/2014 Document Reviewed: 12/31/2012 Elsevier Interactive Patient Education 2016 Elsevier Inc.  

## 2015-06-28 NOTE — ED Notes (Signed)
No cough, eating and drinking okay, no sorethroat.

## 2015-06-28 NOTE — ED Provider Notes (Signed)
CSN: [id number]     Arrival date & time 06/28/15  4098 History   First MD Initiated Contact with Patient 06/28/15 0857     Chief Complaint  Patient presents with  . Nasal Congestion     (Consider location/radiation/quality/duration/timing/severity/associated sxs/prior Treatment) Patient is a 25 y.o. male presenting with allergic reaction. The history is provided by the patient.  Allergic Reaction Presenting symptoms: itching (of eyes with runny nose)   Itching:    Location: Eyes.   Severity:  Mild   Onset quality:  Gradual   Timing:  Constant   Progression:  Unchanged Severity:  Mild Prior allergic episodes:  Seasonal allergies Relieved by:  Nothing Worsened by:  Nothing tried Ineffective treatments: zyrtec.   History reviewed. No pertinent past medical history. History reviewed. No pertinent past surgical history. History reviewed. No pertinent family history. Social History  Substance Use Topics  . Smoking status: Never Smoker   . Smokeless tobacco: None  . Alcohol Use: No    Review of Systems  Skin: Positive for itching (of eyes with runny nose).  All other systems reviewed and are negative.     Allergies  Review of patient's allergies indicates no known allergies.  Home Medications   Prior to Admission medications   Medication Sig Start Date End Date Taking? Authorizing Provider  azithromycin (ZITHROMAX) 500 MG tablet Take 1 tablet (500 mg total) by mouth daily. 06/13/12   Paula Libra, MD  butalbital-acetaminophen-caffeine (FIORICET) 50-325-40 MG per tablet Take 1-2 tablets by mouth every 8 (eight) hours as needed for headache. 08/08/14 08/08/15  Toy Cookey, MD  HYDROcodone-acetaminophen (NORCO/VICODIN) 5-325 MG per tablet Take 1-2 tablets by mouth every 6 (six) hours as needed for pain. 06/13/12   John Molpus, MD  hydrocortisone 1 % cream Apply topically as needed. Red dry cracked skin     Historical Provider, MD  methocarbamol (ROBAXIN) 500 MG tablet Take 1  tablet (500 mg total) by mouth 2 (two) times daily. 10/23/14   April Palumbo, MD  naproxen (NAPROSYN) 375 MG tablet Take 1 tablet (375 mg total) by mouth 2 (two) times daily. 10/23/14   April Palumbo, MD  ondansetron (ZOFRAN ODT) 4 MG disintegrating tablet Take 1 tablet (4 mg total) by mouth every 8 (eight) hours as needed for nausea or vomiting. 04/20/15   Teressa Lower, NP   BP 148/116 mmHg  Pulse 100  Temp(Src) 97.9 F (36.6 C) (Oral)  Resp 18  SpO2 100% Physical Exam  Constitutional: He is oriented to person, place, and time. He appears well-developed and well-nourished. No distress.  HENT:  Head: Normocephalic and atraumatic.  Nasal congestion  Eyes: Conjunctivae are normal.  Neck: Neck supple. No tracheal deviation present.  Cardiovascular: Normal rate and regular rhythm.   Pulmonary/Chest: Effort normal. No respiratory distress.  Abdominal: Soft. He exhibits no distension.  Neurological: He is alert and oriented to person, place, and time.  Skin: Skin is warm and dry.  Psychiatric: He has a normal mood and affect.    ED Course  Procedures (including critical care time) Labs Review Labs Reviewed - No data to display  Imaging Review No results found. I have personally reviewed and evaluated these images and lab results as part of my medical decision-making.   EKG Interpretation None      MDM   Final diagnoses:  Nasal congestion  Seasonal allergic rhinitis   25 y.o. male presents with nasal congestion and itchy eyes. Consistent with seasonal allergies. Zyrtec not working for him.  No signs of infection. We'll switch to Allegra and Flonase for symptoms.    Lyndal Pulley, MD 06/28/15 [phone]

## 2015-12-02 ENCOUNTER — Emergency Department (HOSPITAL_BASED_OUTPATIENT_CLINIC_OR_DEPARTMENT_OTHER)
Admission: EM | Admit: 2015-12-02 | Discharge: 2015-12-02 | Disposition: A | Discharge status: Home / Self Care | Payer: 59 | Attending: Emergency Medicine | Admitting: Emergency Medicine

## 2015-12-02 ENCOUNTER — Encounter (HOSPITAL_BASED_OUTPATIENT_CLINIC_OR_DEPARTMENT_OTHER): Payer: Self-pay

## 2015-12-02 DIAGNOSIS — N342 Other urethritis: Secondary | ICD-10-CM | POA: Diagnosis not present

## 2015-12-02 DIAGNOSIS — N4889 Other specified disorders of penis: Secondary | ICD-10-CM | POA: Diagnosis present

## 2015-12-02 LAB — URINALYSIS, ROUTINE W REFLEX MICROSCOPIC
BILIRUBIN URINE: NEGATIVE
GLUCOSE, UA: NEGATIVE mg/dL
Hgb urine dipstick: NEGATIVE
KETONES UR: NEGATIVE mg/dL
NITRITE: NEGATIVE
PROTEIN: NEGATIVE mg/dL
Specific Gravity, Urine: 1.026 (ref 1.005–1.030)
pH: 7.5 (ref 5.0–8.0)

## 2015-12-02 LAB — URINE MICROSCOPIC-ADD ON

## 2015-12-02 MED ORDER — LIDOCAINE HCL (PF) 1 % IJ SOLN
INTRAMUSCULAR | Status: AC
  Administered 2015-12-02: 0.9 mL
  Filled 2015-12-02: qty 5

## 2015-12-02 MED ORDER — AZITHROMYCIN 1 G PO PACK: 1.0000 g | PACK | Freq: Once | ORAL | Status: DC

## 2015-12-02 MED ORDER — AZITHROMYCIN 250 MG PO TABS
1000.0000 mg | ORAL_TABLET | Freq: Once | ORAL | Status: AC
  Administered 2015-12-02: 1000 mg via ORAL
  Filled 2015-12-02: qty 4

## 2015-12-02 MED ORDER — CEFTRIAXONE SODIUM 250 MG IJ SOLR
250.0000 mg | Freq: Once | INTRAMUSCULAR | Status: AC
  Administered 2015-12-02: 250 mg via INTRAMUSCULAR
  Filled 2015-12-02: qty 250

## 2015-12-02 NOTE — ED Triage Notes (Signed)
Pt c/o tingling in penis since Saturday morning, denies discharge, denies dysuria, thinks it "is trying to close up."

## 2015-12-02 NOTE — ED Provider Notes (Signed)
[facility] Provider Note   CSN: [id number] Arrival date & time: 12/02/15  4098  First Provider Contact:  First MD Initiated Contact with Patient 12/02/15 [phone]        History   Chief Complaint Chief Complaint  Patient presents with  . Penis Pain    HPI David Wilkerson is a 25 y.o. male.  Pt said that he has had tingling in his penis.  It has been going on for the past few days.  He denies discharge or dysuria.  He feels like his meatus is trying to close up.  He denies any pain to his penis or groin.  Pt denies any trouble initiating urine stream.   The history is provided by the patient.  Penis Pain  This is a new problem. The current episode started more than 2 days ago.    History reviewed. No pertinent past medical history.  There are no active problems to display for this patient.   History reviewed. No pertinent surgical history.     Home Medications    Prior to Admission medications   Medication Sig Start Date End Date Taking? Authorizing Provider  azithromycin (ZITHROMAX) 500 MG tablet Take 1 tablet (500 mg total) by mouth daily. 06/13/12   John Molpus, MD  fexofenadine-pseudoephedrine (ALLEGRA-D) 60-120 MG 12 hr tablet Take 1 tablet by mouth every 12 (twelve) hours. Patient not taking: Reported on 12/02/2015 06/28/15   Lyndal Pulley, MD  fluticasone [facility]) 50 MCG/ACT nasal spray Place 2 sprays into both nostrils daily. Patient not taking: Reported on 12/02/2015 06/28/15   Lyndal Pulley, MD  HYDROcodone-acetaminophen (NORCO/VICODIN) 5-325 MG per tablet Take 1-2 tablets by mouth every 6 (six) hours as needed for pain. 06/13/12   John Molpus, MD  hydrocortisone 1 % cream Apply topically as needed. Red dry cracked skin     Historical Provider, MD  methocarbamol (ROBAXIN) 500 MG tablet Take 1 tablet (500 mg total) by mouth 2 (two) times daily. 10/23/14   April Palumbo, MD  naproxen (NAPROSYN) 375 MG tablet Take 1 tablet (375 mg total) by mouth 2 (two) times  daily. 10/23/14   April Palumbo, MD  ondansetron (ZOFRAN ODT) 4 MG disintegrating tablet Take 1 tablet (4 mg total) by mouth every 8 (eight) hours as needed for nausea or vomiting. 04/20/15   Teressa Lower, NP    Family History No family history on file.  Social History Social History  Substance Use Topics  . Smoking status: Never Smoker  . Smokeless tobacco: Not on file  . Alcohol use No     Allergies   Review of patient's allergies indicates no known allergies.   Review of Systems Review of Systems  Genitourinary: Positive for penile pain.  All other systems reviewed and are negative.    Physical Exam Updated Vital Signs BP 162/100 (BP Location: Left Wrist)   Pulse 72   Temp 97.8 F (36.6 C) (Oral)   Resp 18   Ht 6' (1.829 m)   Wt 225 lb (102.1 kg)   SpO2 100%   BMI 30.52 kg/m   Physical Exam  Constitutional: He is oriented to person, place, and time. He appears well-developed and well-nourished.  HENT:  Head: Normocephalic and atraumatic.  Right Ear: External ear normal.  Left Ear: External ear normal.  Nose: Nose normal.  Mouth/Throat: Oropharynx is clear and moist.  Eyes: Conjunctivae and EOM are normal. Pupils are equal, round, and reactive to light.  Neck: Normal range of motion. Neck supple.  Cardiovascular: Normal rate, regular rhythm, normal heart sounds and intact distal pulses.   Pulmonary/Chest: Effort normal and breath sounds normal.  Abdominal: Soft. Bowel sounds are normal.  Genitourinary: Testes normal and penis normal. Circumcised.  Genitourinary Comments: Penis and meatus are nl.  No abnormalities.  Musculoskeletal: Normal range of motion.  Neurological: He is alert and oriented to person, place, and time.  Skin: Skin is warm and dry.  Psychiatric: He has a normal mood and affect. His behavior is normal. Judgment and thought content normal.  Nursing note and vitals reviewed.    ED Treatments / Results  Labs (all labs ordered are  listed, but only abnormal results are displayed) Labs Reviewed  URINALYSIS, ROUTINE W REFLEX MICROSCOPIC (NOT AT [facility]) - Abnormal; Notable for the following:       Result Value   Leukocytes, UA SMALL (*)    All other components within normal limits  URINE MICROSCOPIC-ADD ON - Abnormal; Notable for the following:    Squamous Epithelial / LPF 0-5 (*)    Bacteria, UA FEW (*)    All other components within normal limits  GC/CHLAMYDIA PROBE AMP ([place]) NOT AT [facility]    EKG  EKG Interpretation None       Radiology No results found.  Procedures Procedures (including critical care time)  Medications Ordered in ED Medications  cefTRIAXone (ROCEPHIN) injection 250 mg (not administered)  azithromycin (ZITHROMAX) tablet 1,000 mg (not administered)     Initial Impression / Assessment and Plan / ED Course  I have reviewed the triage vital signs and the nursing notes.  Pertinent labs & imaging results that were available during my care of the patient were reviewed by me and considered in my medical decision making (see chart for details).  Clinical Course   Pt's anatomy looks nl.  I will treat pt for GC/Chl just in case.  Pt given the number to urology if sx persist.  Pt knows to return if worse.  Final Clinical Impressions(s) / ED Diagnoses   Final diagnoses:  Urethritis    New Prescriptions New Prescriptions   No medications on file     Jacalyn Lefevre, MD 12/02/15 [phone]

## 2015-12-03 LAB — GC/CHLAMYDIA PROBE AMP (~~LOC~~) NOT AT ARMC
Chlamydia: NEGATIVE
NEISSERIA GONORRHEA: NEGATIVE

## 2016-05-14 ENCOUNTER — Encounter (HOSPITAL_BASED_OUTPATIENT_CLINIC_OR_DEPARTMENT_OTHER): Payer: Self-pay | Admitting: *Deleted

## 2016-05-14 ENCOUNTER — Emergency Department (HOSPITAL_BASED_OUTPATIENT_CLINIC_OR_DEPARTMENT_OTHER)
Admission: EM | Admit: 2016-05-14 | Discharge: 2016-05-14 | Disposition: A | Discharge status: Home / Self Care | Payer: 59 | Attending: Dermatology | Admitting: Dermatology

## 2016-05-14 DIAGNOSIS — M549 Dorsalgia, unspecified: Secondary | ICD-10-CM | POA: Diagnosis not present

## 2016-05-14 DIAGNOSIS — Y999 Unspecified external cause status: Secondary | ICD-10-CM | POA: Insufficient documentation

## 2016-05-14 DIAGNOSIS — Y9389 Activity, other specified: Secondary | ICD-10-CM | POA: Insufficient documentation

## 2016-05-14 DIAGNOSIS — Z5321 Procedure and treatment not carried out due to patient leaving prior to being seen by health care provider: Secondary | ICD-10-CM | POA: Diagnosis not present

## 2016-05-14 DIAGNOSIS — S3992XA Unspecified injury of lower back, initial encounter: Secondary | ICD-10-CM | POA: Diagnosis present

## 2016-05-14 DIAGNOSIS — X58XXXA Exposure to other specified factors, initial encounter: Secondary | ICD-10-CM | POA: Diagnosis not present

## 2016-05-14 DIAGNOSIS — Z79899 Other long term (current) drug therapy: Secondary | ICD-10-CM | POA: Insufficient documentation

## 2016-05-14 DIAGNOSIS — Y929 Unspecified place or not applicable: Secondary | ICD-10-CM | POA: Diagnosis not present

## 2016-05-14 NOTE — ED Notes (Signed)
Pt states he is feeling much better

## 2016-05-14 NOTE — ED Triage Notes (Signed)
Pt states he was working on his car earlier today and injured his back. Denies other s/s.

## 2016-06-26 ENCOUNTER — Emergency Department (HOSPITAL_BASED_OUTPATIENT_CLINIC_OR_DEPARTMENT_OTHER)
Admission: EM | Admit: 2016-06-26 | Discharge: 2016-06-26 | Disposition: A | Discharge status: Home / Self Care | Payer: 59 | Attending: Physician Assistant | Admitting: Physician Assistant

## 2016-06-26 ENCOUNTER — Encounter (HOSPITAL_BASED_OUTPATIENT_CLINIC_OR_DEPARTMENT_OTHER): Payer: Self-pay | Admitting: Emergency Medicine

## 2016-06-26 DIAGNOSIS — G44019 Episodic cluster headache, not intractable: Secondary | ICD-10-CM

## 2016-06-26 MED ORDER — SUMATRIPTAN SUCCINATE 6 MG/0.5ML ~~LOC~~ SOLN
6.0000 mg | Freq: Once | SUBCUTANEOUS | Status: DC
  Filled 2016-06-26: qty 0.5

## 2016-06-26 NOTE — ED Triage Notes (Signed)
Intermittent H/A x 3 days . Denies at present . States had "cluster h/a's "last year. Took 800mg  Advil  x 2 hrs ago

## 2016-06-26 NOTE — Discharge Instructions (Signed)
We want you to follow up with neurology as needed for your cluster headaches.

## 2016-06-26 NOTE — ED Provider Notes (Addendum)
[facility]   CSN: [id number] Arrival date & time: 06/26/16  1912   By signing my name below, I, David Wilkerson, attest that this documentation has been prepared under the direction and in the presence of David Wilkerson David Zniya Cottone, MD. Electronically Signed: Soijett Wilkerson, ED Scribe. 06/26/16. 9:41 PM.  History   Chief Complaint Chief Complaint  Patient presents with  . Headache    HPI David Wilkerson is a 26 y.o. male who presents to the Emergency Department complaining of intermittent frontal HA onset 3-4 days ago. He states that he has tried 800 mg Advil with his last dose being 2 hours ago with no relief for his symptoms. Pt notes that he was dx with "cluster" HA last years while in the ED with no headaches since. He denies nausea, vomiting, diarrhea, fever, chills, and any other symptoms.    The history is provided by the patient. No language interpreter was used.    History reviewed. No pertinent past medical history.  There are no active problems to display for this patient.   History reviewed. No pertinent surgical history.     Home Medications    Prior to Admission medications   Medication Sig Start Date End Date Taking? Authorizing Provider  azithromycin (ZITHROMAX) 500 MG tablet Take 1 tablet (500 mg total) by mouth daily. 06/13/12   John Molpus, MD  fexofenadine-pseudoephedrine (ALLEGRA-D) 60-120 MG 12 hr tablet Take 1 tablet by mouth every 12 (twelve) hours. Patient not taking: Reported on 12/02/2015 06/28/15   Lyndal Pulleyaniel Knott, MD  fluticasone [facility](FLONASE) 50 MCG/ACT nasal spray Place 2 sprays into both nostrils daily. Patient not taking: Reported on 12/02/2015 06/28/15   Lyndal Pulleyaniel Knott, MD  HYDROcodone-acetaminophen (NORCO/VICODIN) 5-325 MG per tablet Take 1-2 tablets by mouth every 6 (six) hours as needed for pain. 06/13/12   John Molpus, MD  hydrocortisone 1 % cream Apply topically as needed. Red dry cracked skin     Historical Provider, MD    methocarbamol (ROBAXIN) 500 MG tablet Take 1 tablet (500 mg total) by mouth 2 (two) times daily. 10/23/14   April Palumbo, MD  naproxen (NAPROSYN) 375 MG tablet Take 1 tablet (375 mg total) by mouth 2 (two) times daily. 10/23/14   April Palumbo, MD  ondansetron (ZOFRAN ODT) 4 MG disintegrating tablet Take 1 tablet (4 mg total) by mouth every 8 (eight) hours as needed for nausea or vomiting. 04/20/15   Teressa LowerVrinda Pickering, NP    Family History No family history on file.  Social History Social History  Substance Use Topics  . Smoking status: Never Smoker  . Smokeless tobacco: Never Used  . Alcohol use No     Allergies   Patient has no known allergies.   Review of Systems Review of Systems  Constitutional: Negative for chills and fever.  Gastrointestinal: Negative for diarrhea, nausea and vomiting.  Neurological: Positive for headaches.     Physical Exam Updated Vital Signs BP 132/93 (BP Location: Right Arm)   Pulse 90   Temp 98.1 F (36.7 C) (Oral)   Resp 16   Ht 6' (1.829 m)   Wt 250 lb (113.4 kg)   SpO2 100%   BMI 33.91 kg/m   Physical Exam  Constitutional: He is oriented to person, place, and time. He appears well-nourished.  HENT:  Head: Normocephalic.  Eyes: Conjunctivae and EOM are normal. Pupils are equal, round, and reactive to light. Right eye exhibits no discharge. Left eye exhibits no discharge.  Cardiovascular: Normal rate.  Pulmonary/Chest: Effort normal.  Neurological: He is oriented to person, place, and time.  Cn 2-12 intact  Skin: Skin is warm and dry. He is not diaphoretic.  Psychiatric: He has a normal mood and affect. His behavior is normal.     ED Treatments / Results  DIAGNOSTIC STUDIES: Oxygen Saturation is 100% on RA, nl by my interpretation.    COORDINATION OF CARE: 8:48 PM Discussed treatment plan with pt at bedside which includes placing oxygen and pt agreed to plan.   Labs (all labs ordered are listed, but only abnormal results  are displayed) Labs Reviewed - No data to display  EKG  EKG Interpretation None       Radiology No results found.  Procedures Procedures (including critical care time)  Medications Ordered in ED Medications  SUMAtriptan (IMITREX) injection 6 mg (6 mg Subcutaneous Not Given 06/26/16 2136)     Initial Impression / Assessment and Plan / ED Course  I have reviewed the triage vital signs and the nursing notes.  Pertinent labs & imaging results that were available during my care of the patient were reviewed by me and considered in my medical decision making (see chart for details).     Patient is a healthy appearing 26 year old male presenting with cluster headache. Patient has history of cluster headaches. Not suspect any intercranial pathology given the normal neurologic exam and history of cluster headaches. We will treat him with oxygen as initial therapy. We'll have him follow-up with neurology as needed.  Addition to oxygen, will give 6 mg of sumatriptan subcutaneously as an abortive measure.  9:41 PM Patient's cluster headache is completely resolved with oxygen. No need to give sumatriptan at this time. We'll have him follow-up with neurology.  Final Clinical Impressions(s) / ED Diagnoses   Final diagnoses:  None    New Prescriptions New Prescriptions   No medications on file     Simeon Vera Wilkerson An, MD 06/26/16 2141    Lerae Langham Lyn Roshun Klingensmith, MD 07/06/16 2328

## 2017-03-25 ENCOUNTER — Emergency Department (HOSPITAL_BASED_OUTPATIENT_CLINIC_OR_DEPARTMENT_OTHER)
Admission: EM | Admit: 2017-03-25 | Discharge: 2017-03-25 | Disposition: A | Discharge status: Home / Self Care | Payer: 59 | Attending: Emergency Medicine | Admitting: Emergency Medicine

## 2017-03-25 ENCOUNTER — Encounter (HOSPITAL_BASED_OUTPATIENT_CLINIC_OR_DEPARTMENT_OTHER): Payer: Self-pay | Admitting: Emergency Medicine

## 2017-03-25 ENCOUNTER — Other Ambulatory Visit: Payer: Self-pay

## 2017-03-25 DIAGNOSIS — R110 Nausea: Secondary | ICD-10-CM | POA: Insufficient documentation

## 2017-03-25 DIAGNOSIS — Z79899 Other long term (current) drug therapy: Secondary | ICD-10-CM | POA: Insufficient documentation

## 2017-03-25 DIAGNOSIS — Z113 Encounter for screening for infections with a predominantly sexual mode of transmission: Secondary | ICD-10-CM | POA: Insufficient documentation

## 2017-03-25 MED ORDER — ONDANSETRON 4 MG PO TBDP
4.0000 mg | ORAL_TABLET | Freq: Once | ORAL | Status: AC
  Administered 2017-03-25: 4 mg via ORAL
  Filled 2017-03-25: qty 1

## 2017-03-25 NOTE — ED Provider Notes (Signed)
[facility] EMERGENCY DEPARTMENT Provider Note   CSN: [id number] Arrival date & time: 03/25/17  0207     History   Chief Complaint Chief Complaint  Patient presents with  . Nausea    HPI David Wilkerson is a 26 y.o. male.  The history is provided by the patient.  Illness  This is a new problem. The current episode started more than 2 days ago. The problem occurs constantly. The problem has not changed since onset.Pertinent negatives include no chest pain, no abdominal pain, no headaches and no shortness of breath. Nothing aggravates the symptoms. Nothing relieves the symptoms. He has tried nothing for the symptoms. The treatment provided no relief.  Wants to be tested for STIs but has not symptoms with the same partner.  Also has nausea without emesis x 2 days.    History reviewed. No pertinent past medical history.  There are no active problems to display for this patient.   History reviewed. No pertinent surgical history.     Home Medications    Prior to Admission medications   Medication Sig Start Date End Date Taking? Authorizing Provider  azithromycin (ZITHROMAX) 500 MG tablet Take 1 tablet (500 mg total) by mouth daily. 06/13/12   Molpus, John, MD  fexofenadine-pseudoephedrine (ALLEGRA-D) 60-120 MG 12 hr tablet Take 1 tablet by mouth every 12 (twelve) hours. Patient not taking: Reported on 12/02/2015 06/28/15   Lyndal PulleyKnott, Daniel, MD  fluticasone [facility](FLONASE) 50 MCG/ACT nasal spray Place 2 sprays into both nostrils daily. Patient not taking: Reported on 12/02/2015 06/28/15   Lyndal PulleyKnott, Daniel, MD  HYDROcodone-acetaminophen (NORCO/VICODIN) 5-325 MG per tablet Take 1-2 tablets by mouth every 6 (six) hours as needed for pain. 06/13/12   Molpus, John, MD  hydrocortisone 1 % cream Apply topically as needed. Red dry cracked skin     [provider]  methocarbamol (ROBAXIN) 500 MG tablet Take 1 tablet (500 mg total) by mouth 2 (two) times daily. 10/23/14   Kairen Hallinan,  MD  naproxen (NAPROSYN) 375 MG tablet Take 1 tablet (375 mg total) by mouth 2 (two) times daily. 10/23/14   Lynzie Cliburn, MD  ondansetron (ZOFRAN ODT) 4 MG disintegrating tablet Take 1 tablet (4 mg total) by mouth every 8 (eight) hours as needed for nausea or vomiting. 04/20/15   Teressa LowerPickering, Vrinda, NP    Family History No family history on file.  Social History Social History   Tobacco Use  . Smoking status: Never Smoker  . Smokeless tobacco: Never Used  Substance Use Topics  . Alcohol use: No  . Drug use: No     Allergies   Patient has no known allergies.   Review of Systems Review of Systems  Constitutional: Negative for fever.  Respiratory: Negative for shortness of breath.   Cardiovascular: Negative for chest pain.  Gastrointestinal: Positive for nausea. Negative for abdominal pain, constipation, diarrhea and vomiting.  Genitourinary: Negative for difficulty urinating, discharge, dysuria, frequency, genital sores, penile pain, penile swelling and scrotal swelling.  Neurological: Negative for headaches.  All other systems reviewed and are negative.    Physical Exam Updated Vital Signs BP (!) 158/94   Pulse 98   Temp 98.4 F (36.9 C) (Oral)   Resp 18   Ht 6\' 1"  (1.854 m)   Wt 113.4 kg (250 lb)   SpO2 99%   BMI 32.98 kg/m   Physical Exam  Constitutional: He is oriented to person, place, and time. He appears well-developed and well-nourished. No distress.  HENT:  Head: Normocephalic and atraumatic.  Nose: Nose normal.  Mouth/Throat: No oropharyngeal exudate.  Eyes: Conjunctivae are normal. Pupils are equal, round, and reactive to light.  Neck: Normal range of motion. Neck supple.  Cardiovascular: Normal rate, regular rhythm, normal heart sounds and intact distal pulses.  Pulmonary/Chest: Effort normal and breath sounds normal. No stridor. He has no wheezes. He has no rales.  Abdominal: Soft. Bowel sounds are normal. He exhibits no mass. There is no  tenderness. There is no rebound and no guarding.  Genitourinary: No penile tenderness.  Genitourinary Comments: Chaperone present  Musculoskeletal: Normal range of motion.  Neurological: He is alert and oriented to person, place, and time. He displays normal reflexes.  Skin: Skin is warm and dry. Capillary refill takes less than 2 seconds.  Psychiatric: He has a normal mood and affect.     ED Treatments / Results  Labs (all labs ordered are listed, but only abnormal results are displayed) Labs Reviewed  GC/CHLAMYDIA PROBE AMP ([place]) NOT AT [facility]     Procedures Procedures (including critical care time)  Medications Ordered in ED Medications  ondansetron (ZOFRAN-ODT) disintegrating tablet 4 mg (4 mg Oral Given 03/25/17 0419)     Final Clinical Impressions(s) / ED Diagnoses   Final diagnoses:  Nausea   Exam is benign and reassuring patient will be called with results if positive.  Better post zofran.    All questions answered to the patient's satisfaction.    Strict return precautions for fever, global weakness, blood in the urine, abdominal distention, vomiting, no drainage from the foley catheter, swelling or the lips or tongue, chest pain, dyspnea on exertion, new weakness or numbness changes in vision or speech, fevers, weakness persistent pain, Inability to tolerate liquids or food, changes in voice cough, altered mental status or any concerns. No signs of systemic illness or infection. The patient is nontoxic-appearing on exam and vital signs are within normal limits.    I have reviewed the triage vital signs and the nursing notes. Pertinent labs &imaging results that were available during my care of the patient were reviewed by me and considered in my medical decision making (see chart for details).  After history, exam, and medical workup I feel the patient has been appropriately medically screened and is safe for discharge home. Pertinent diagnoses were  discussed with the patient. Patient was given return precautions   Deyci Gesell, MD 03/25/17 [id number]

## 2017-03-25 NOTE — ED Notes (Signed)
ED Provider at bedside. 

## 2017-03-25 NOTE — ED Triage Notes (Signed)
Pt presents with c/o of nausea and heartburn for 2 days states he feels like he needs to burp but can not. Pt also wants to be check for STD.

## 2017-03-27 LAB — GC/CHLAMYDIA PROBE AMP (~~LOC~~) NOT AT ARMC
Chlamydia: NEGATIVE
Neisseria Gonorrhea: NEGATIVE

## 2018-06-25 ENCOUNTER — Other Ambulatory Visit: Payer: Self-pay

## 2018-06-25 ENCOUNTER — Encounter (HOSPITAL_BASED_OUTPATIENT_CLINIC_OR_DEPARTMENT_OTHER): Payer: Self-pay | Admitting: *Deleted

## 2018-06-25 ENCOUNTER — Emergency Department (HOSPITAL_BASED_OUTPATIENT_CLINIC_OR_DEPARTMENT_OTHER)
Admission: EM | Admit: 2018-06-25 | Discharge: 2018-06-25 | Disposition: A | Discharge status: Home / Self Care | Payer: 59 | Attending: Emergency Medicine | Admitting: Emergency Medicine

## 2018-06-25 DIAGNOSIS — Z79899 Other long term (current) drug therapy: Secondary | ICD-10-CM | POA: Insufficient documentation

## 2018-06-25 DIAGNOSIS — J111 Influenza due to unidentified influenza virus with other respiratory manifestations: Secondary | ICD-10-CM | POA: Diagnosis not present

## 2018-06-25 DIAGNOSIS — J029 Acute pharyngitis, unspecified: Secondary | ICD-10-CM | POA: Diagnosis present

## 2018-06-25 LAB — GROUP A STREP BY PCR: GROUP A STREP BY PCR: NOT DETECTED

## 2018-06-25 MED ORDER — OSELTAMIVIR PHOSPHATE 75 MG PO CAPS
75.0000 mg | ORAL_CAPSULE | Freq: Once | ORAL | Status: AC
  Administered 2018-06-25: 75 mg via ORAL
  Filled 2018-06-25: qty 1

## 2018-06-25 MED ORDER — OSELTAMIVIR PHOSPHATE 75 MG PO CAPS: 75.0000 mg | ORAL_CAPSULE | Freq: Two times a day (BID) | ORAL | 0 refills | Status: DC

## 2018-06-25 MED ORDER — ACETAMINOPHEN 500 MG PO TABS: 1000.0000 mg | ORAL_TABLET | Freq: Once | ORAL | Status: DC

## 2018-06-25 MED ORDER — ACETAMINOPHEN 325 MG PO TABS
650.0000 mg | ORAL_TABLET | Freq: Once | ORAL | Status: AC
  Administered 2018-06-25: 650 mg via ORAL
  Filled 2018-06-25: qty 2

## 2018-06-25 MED ORDER — IBUPROFEN 800 MG PO TABS
800.0000 mg | ORAL_TABLET | Freq: Once | ORAL | Status: AC
  Administered 2018-06-25: 800 mg via ORAL
  Filled 2018-06-25: qty 1

## 2018-06-25 NOTE — ED Triage Notes (Signed)
pt c/o URI symtpoms x 3 days

## 2018-06-25 NOTE — ED Provider Notes (Signed)
[facility] EMERGENCY DEPARTMENT Provider Note   CSN: [id number] Arrival date & time: 06/25/18  1414    History   Chief Complaint Chief Complaint  Patient presents with  . URI    HPI David Wilkerson is a 28 y.o. male.     Patient is a 28 year old male who presents to the emergency department with a complaint of upper respiratory symptoms.  The patient states that he has been ill over the last 3 days.  He has been having chills, body aches, decreased appetite, weakness, sore throat.  He is not measured a temperature, but feels like he has had fever at times.  No vomiting or diarrhea reported.  The patient states he works around a lot of other people, he is not aware of anyone who has the flu.  He has not been traveling, has not been out of the country recently.  The history is provided by the patient.  URI  Presenting symptoms: congestion, fever and sore throat   Presenting symptoms: no cough   Associated symptoms: headaches and myalgias   Associated symptoms: no arthralgias, no neck pain and no wheezing     History reviewed. No pertinent past medical history.  There are no active problems to display for this patient.   History reviewed. No pertinent surgical history.      Home Medications    Prior to Admission medications   Medication Sig Start Date End Date Taking? Authorizing Provider  fexofenadine-pseudoephedrine (ALLEGRA-D) 60-120 MG 12 hr tablet Take 1 tablet by mouth every 12 (twelve) hours. Patient not taking: Reported on 12/02/2015 06/28/15   Lyndal PulleyKnott, Daniel, MD  fluticasone [facility](FLONASE) 50 MCG/ACT nasal spray Place 2 sprays into both nostrils daily. Patient not taking: Reported on 12/02/2015 06/28/15   Lyndal PulleyKnott, Daniel, MD  hydrocortisone 1 % cream Apply topically as needed. Red dry cracked skin     [provider]  naproxen (NAPROSYN) 375 MG tablet Take 1 tablet (375 mg total) by mouth 2 (two) times daily. 10/23/14   Palumbo, April, MD    Family  History History reviewed. No pertinent family history.  Social History Social History   Tobacco Use  . Smoking status: Never Smoker  . Smokeless tobacco: Never Used  Substance Use Topics  . Alcohol use: No  . Drug use: No     Allergies   Patient has no known allergies.   Review of Systems Review of Systems  Constitutional: Positive for activity change, appetite change, chills and fever.       All ROS Neg except as noted in HPI  HENT: Positive for congestion, postnasal drip and sore throat. Negative for nosebleeds.   Eyes: Negative for photophobia and discharge.  Respiratory: Negative for cough, shortness of breath and wheezing.   Cardiovascular: Negative for chest pain and palpitations.  Gastrointestinal: Negative for abdominal pain and blood in stool.  Genitourinary: Negative for dysuria, frequency and hematuria.  Musculoskeletal: Positive for myalgias. Negative for arthralgias, back pain and neck pain.  Skin: Negative.   Neurological: Positive for headaches. Negative for dizziness, seizures and speech difficulty.  Psychiatric/Behavioral: Negative for confusion and hallucinations.     Physical Exam Updated Vital Signs BP (!) 165/99   Pulse (!) 116   Temp 99.8 F (37.7 C) (Oral)   Resp 18   Ht 6' (1.829 m)   Wt 113.4 kg   SpO2 98%   BMI 33.91 kg/m   Physical Exam Vitals signs and nursing note reviewed.  Constitutional:  Appearance: He is well-developed. He is ill-appearing. He is not toxic-appearing.  HENT:     Head: Normocephalic.     Right Ear: Tympanic membrane and external ear normal.     Left Ear: Tympanic membrane and external ear normal.     Nose: Congestion present.     Mouth/Throat:     Pharynx: Posterior oropharyngeal erythema present.     Comments: There are 3 red blisters of the posterior pharynx.  There is mild to moderate increased redness of the posterior pharynx.  The uvula is enlarged with also increased redness noted.  The airway is  patent. Eyes:     General: Lids are normal.     Pupils: Pupils are equal, round, and reactive to light.  Neck:     Musculoskeletal: Normal range of motion and neck supple.     Vascular: No carotid bruit.  Cardiovascular:     Rate and Rhythm: Regular rhythm. Tachycardia present.     Pulses: Normal pulses.     Heart sounds: Normal heart sounds.  Pulmonary:     Effort: No respiratory distress.     Breath sounds: Normal breath sounds.  Abdominal:     General: Bowel sounds are normal.     Palpations: Abdomen is soft.     Tenderness: There is no abdominal tenderness. There is no guarding.  Musculoskeletal: Normal range of motion.  Lymphadenopathy:     Head:     Right side of head: No submandibular adenopathy.     Left side of head: No submandibular adenopathy.     Cervical: No cervical adenopathy.  Skin:    General: Skin is warm and dry.     Findings: No rash.  Neurological:     Mental Status: He is alert and oriented to person, place, and time.     Cranial Nerves: No cranial nerve deficit.     Sensory: No sensory deficit.     Coordination: Coordination normal.  Psychiatric:        Speech: Speech normal.      ED Treatments / Results  Labs (all labs ordered are listed, but only abnormal results are displayed) Labs Reviewed  GROUP A STREP BY PCR    EKG None  Radiology No results found.  Procedures Procedures (including critical care time)  Medications Ordered in ED Medications  ibuprofen (ADVIL,MOTRIN) tablet 800 mg (has no administration in time range)  acetaminophen (TYLENOL) tablet 650 mg (has no administration in time range)     Initial Impression / Assessment and Plan / ED Course  I have reviewed the triage vital signs and the nursing notes.  Pertinent labs & imaging results that were available during my care of the patient were reviewed by me and considered in my medical decision making (see chart for details).          Final Clinical  Impressions(s) / ED Diagnoses MDM  Temperature is 99.8, pulse rate is 116, blood pressure is 165/99.  The pulse oximetry is 98% on room air.  Within normal limits by my interpretation.  The patient has been having about 3 days of body aches, subjective fever, chills, and weakness.  He also has a sore throat.  On examination he has 3 red blisters on the posterior pharynx.  Will check for strep infection.  Strep test is negative.  Pt to be treated for influenza. Rx for tamiflu given. Pt to use tylenol or ibuprofen for fever or aching. Mask given to the patient. To to return  to ED if any changes or problem.   Final diagnoses:  Influenza    ED Discharge Orders         Ordered    oseltamivir (TAMIFLU) 75 MG capsule  Every 12 hours     06/25/18 1809           Ivery Quale, PA-C 06/25/18 1843    Arby Barrette, MD 06/25/18 [phone]

## 2018-06-25 NOTE — Discharge Instructions (Addendum)
Your strep test is negative.  Your examination favors influenza.  Please use your mask until symptoms have resolved.  Please use Tylenol every 4 hours or ibuprofen every 6 hours for fever, and/or aching.  Please wash your hands frequently.  Please wipe off surfaces as needed.  Please get adequate rest.  Please increase fluids, as hydration is extremely important.  Use Tamiflu 2 times daily.  Please see your primary physician or return to the emergency department if any changes in condition, problems, or concerns.

## 2018-10-19 ENCOUNTER — Emergency Department (HOSPITAL_BASED_OUTPATIENT_CLINIC_OR_DEPARTMENT_OTHER)
Admission: EM | Admit: 2018-10-19 | Discharge: 2018-10-19 | Disposition: A | Discharge status: Home / Self Care | Payer: 59 | Attending: Emergency Medicine | Admitting: Emergency Medicine

## 2018-10-19 ENCOUNTER — Other Ambulatory Visit: Payer: Self-pay

## 2018-10-19 ENCOUNTER — Encounter (HOSPITAL_BASED_OUTPATIENT_CLINIC_OR_DEPARTMENT_OTHER): Payer: Self-pay | Admitting: Emergency Medicine

## 2018-10-19 DIAGNOSIS — W57XXXA Bitten or stung by nonvenomous insect and other nonvenomous arthropods, initial encounter: Secondary | ICD-10-CM | POA: Diagnosis not present

## 2018-10-19 DIAGNOSIS — R21 Rash and other nonspecific skin eruption: Secondary | ICD-10-CM | POA: Diagnosis present

## 2018-10-19 DIAGNOSIS — Y999 Unspecified external cause status: Secondary | ICD-10-CM | POA: Diagnosis not present

## 2018-10-19 DIAGNOSIS — S40862A Insect bite (nonvenomous) of left upper arm, initial encounter: Secondary | ICD-10-CM | POA: Diagnosis not present

## 2018-10-19 DIAGNOSIS — Y939 Activity, unspecified: Secondary | ICD-10-CM | POA: Diagnosis not present

## 2018-10-19 DIAGNOSIS — Y929 Unspecified place or not applicable: Secondary | ICD-10-CM | POA: Insufficient documentation

## 2018-10-19 MED ORDER — HYDROCORTISONE 2.5 % EX CREA: TOPICAL_CREAM | CUTANEOUS | 0 refills | Status: DC

## 2018-10-19 NOTE — ED Notes (Signed)
ED Provider at bedside. 

## 2018-10-19 NOTE — ED Triage Notes (Signed)
Pt states for the past week he has been dealing with a rash that is on bilateral arms. Itches maybe once a week. Red spots that come and go. No other symptoms

## 2018-10-19 NOTE — ED Provider Notes (Signed)
   [place] DE[facility] Provider Note: Georgena Spurling, MD, FACEP  CSN: [id number] MRN: [id number] ARRIVAL: 10/19/18 at [place]: [place]  10/19/18 5:40 AM David Wilkerson is a 28 y.o. male with about a one-week history of erythematous, raised lesions on his arms with only a few on his legs.  He works in a [employer].  He is not aware of any exposure to insects but suspects this may be insect bites.  He is not aware of having bedbugs at home.  The lesions do not itch.  He denies any systemic symptoms.   History reviewed. No pertinent past medical history.  History reviewed. No pertinent surgical history.  No family history on file.  Social History   Tobacco Use  . Smoking status: Never Smoker  . Smokeless tobacco: Never Used  Substance Use Topics  . Alcohol use: No  . Drug use: No    Prior to Admission medications   Medication Sig Start Date End Date Taking? Authorizing Provider  fluticasone (FLONASE) 50 MCG/ACT nasal spray Place 2 sprays into both nostrils daily. Patient not taking: Reported on 12/02/2015 06/28/15 10/19/18  Leo Grosser, MD    Allergies Patient has no known allergies.   REVIEW OF SYSTEMS  Negative except as noted here or in the History of Present Illness.   PHYSICAL EXAMINATION  Initial Vital Signs Blood pressure (!) 165/98, pulse 84, temperature 98.7 F (37.1 C), temperature source Oral, resp. rate 16, height 6' (1.829 m), weight 113.4 kg, SpO2 97 %.  Examination General: Well-developed, well-nourished male in no acute distress; appearance consistent with age of record HENT: normocephalic; atraumatic Eyes: Normal appearance Neck: supple Heart: regular rate and rhythm Lungs: clear to auscultation bilaterally Abdomen: soft; nondistended Extremities: No deformity; full range of motion Neurologic: Awake, alert and oriented; motor function intact in all extremities and  symmetric; no facial droop Skin: Warm and dry; erythematous welt-like lesions primarily of the upper extremities:    Psychiatric: Normal mood and affect   RESULTS  Summary of this visit's results, reviewed by myself:   EKG Interpretation  Date/Time:    Ventricular Rate:    PR Interval:    QRS Duration:   QT Interval:    QTC Calculation:   R Axis:     Text Interpretation:        Laboratory Studies: No results found for this or any previous visit (from the past 24 hour(s)). Imaging Studies: No results found.  ED COURSE and MDM  Nursing notes and initial vitals signs, including pulse oximetry, reviewed.  Vitals:   10/19/18 0535 10/19/18 0537  BP:  (!) 165/98  Pulse:  84  Resp:  16  Temp:  98.7 F (37.1 C)  TempSrc:  Oral  SpO2:  97%  Weight: 113.4 kg   Height: 6' (1.829 m)    As the patient works with his arms exposed, and given the appearance of the lesions, I suspect these represent insect bites.  He was advised to wear insect repellent on his arms and to examine his bedding at home for bedbugs.   PROCEDURES    ED DIAGNOSES     ICD-10-CM   1. Multiple insect bites  W57.Paulette Blanch, Jenny Reichmann, MD 10/19/18 [phone]

## 2018-10-19 NOTE — ED Notes (Signed)
Pt understood dc material. NAD noted. Script given at dc. All questions answered to satisfaction. Pt escorted to check out window. 

## 2018-11-14 ENCOUNTER — Other Ambulatory Visit: Payer: Self-pay

## 2018-11-14 ENCOUNTER — Encounter (HOSPITAL_BASED_OUTPATIENT_CLINIC_OR_DEPARTMENT_OTHER): Payer: Self-pay

## 2018-11-14 ENCOUNTER — Emergency Department (HOSPITAL_BASED_OUTPATIENT_CLINIC_OR_DEPARTMENT_OTHER)
Admission: EM | Admit: 2018-11-14 | Discharge: 2018-11-14 | Disposition: A | Discharge status: Home / Self Care | Payer: 59 | Attending: Emergency Medicine | Admitting: Emergency Medicine

## 2018-11-14 ENCOUNTER — Emergency Department (HOSPITAL_BASED_OUTPATIENT_CLINIC_OR_DEPARTMENT_OTHER): Payer: 59

## 2018-11-14 DIAGNOSIS — R51 Headache: Secondary | ICD-10-CM | POA: Insufficient documentation

## 2018-11-14 DIAGNOSIS — R519 Headache, unspecified: Secondary | ICD-10-CM

## 2018-11-14 LAB — BASIC METABOLIC PANEL
Anion gap: 7 (ref 5–15)
BUN: 11 mg/dL (ref 6–20)
CO2: 27 mmol/L (ref 22–32)
Calcium: 9.1 mg/dL (ref 8.9–10.3)
Chloride: 105 mmol/L (ref 98–111)
Creatinine, Ser: 1.09 mg/dL (ref 0.61–1.24)
GFR calc Af Amer: >60 mL/min (ref >60)
GFR calc non Af Amer: >60 mL/min (ref >60)
Glucose, Bld: 108 mg/dL — ABNORMAL HIGH (ref 70–99)
Potassium: 3.7 mmol/L (ref 3.5–5.1)
Sodium: 139 mmol/L (ref 135–145)

## 2018-11-14 LAB — CBC
HCT: 45.9 % (ref 39.0–52.0)
Hemoglobin: 14.8 g/dL (ref 13.0–17.0)
MCH: 27.1 pg (ref 26.0–34.0)
MCHC: 32.2 g/dL (ref 30.0–36.0)
MCV: 84.1 fL (ref 80.0–100.0)
Platelets: 343 10*3/uL (ref 150–400)
RBC: 5.46 MIL/uL (ref 4.22–5.81)
RDW: 13.2 % (ref 11.5–15.5)
WBC: 6.3 10*3/uL (ref 4.0–10.5)
nRBC: 0 % (ref 0.0–0.2)

## 2018-11-14 MED ORDER — CELECOXIB 200 MG PO CAPS: 200.0000 mg | ORAL_CAPSULE | Freq: Two times a day (BID) | ORAL | 0 refills | Status: DC

## 2018-11-14 MED ORDER — METOCLOPRAMIDE HCL 5 MG/ML IJ SOLN: 10.0000 mg | Freq: Once | INTRAMUSCULAR | Status: DC

## 2018-11-14 MED ORDER — KETOROLAC TROMETHAMINE 15 MG/ML IJ SOLN
15.0000 mg | Freq: Once | INTRAMUSCULAR | Status: AC
  Administered 2018-11-14: 15 mg via INTRAVENOUS
  Filled 2018-11-14: qty 1

## 2018-11-14 MED ORDER — SODIUM CHLORIDE 0.9 % IV SOLN: INTRAVENOUS | Status: DC

## 2018-11-14 MED ORDER — SODIUM CHLORIDE 0.9 % IV BOLUS
1000.0000 mL | Freq: Once | INTRAVENOUS | Status: AC
  Administered 2018-11-14: 1000 mL via INTRAVENOUS

## 2018-11-14 MED ORDER — DIPHENHYDRAMINE HCL 50 MG/ML IJ SOLN: 12.5000 mg | Freq: Once | INTRAMUSCULAR | Status: DC

## 2018-11-14 MED FILL — CELECOXIB 200 MG CAP: 200 | 30 days supply | Qty: 60 | Fill #0

## 2018-11-14 NOTE — ED Triage Notes (Signed)
C/o HA x 1 month-NAD-steady gait

## 2018-11-14 NOTE — ED Provider Notes (Signed)
[facility] EMERGENCY DEPARTMENT Provider Note   CSN: [id number] Arrival date & time: 11/14/18  1207    History   Chief Complaint Chief Complaint  Patient presents with  . Headache    HPI David Wilkerson is a 28 y.o. male.     Pt presents to the ED today with a headache.  The pt said it's been going on for the past month.  He denies any n/v or f/c.  He said it's been constant.  He's been taking tylenol and ibuprofen which helps a little, but does not get rid of the headache.  He feels like he's been eating and drinking well.  He's been getting enough sleep.  He does not feel like he's been under excessive stress.  He has no known covid exposures.  He has never had headaches like this before 1 month ago.     History reviewed. No pertinent past medical history.  There are no active problems to display for this patient.   History reviewed. No pertinent surgical history.      Home Medications    Prior to Admission medications   Medication Sig Start Date End Date Taking? Authorizing Provider  celecoxib (CELEBREX) 200 MG capsule Take 1 capsule (200 mg total) by mouth 2 (two) times daily. 11/14/18   Isla Pence, MD  hydrocortisone 2.5 % cream Apply to lesions twice daily. 10/19/18   Molpus, John, MD  fluticasone (FLONASE) 50 MCG/ACT nasal spray Place 2 sprays into both nostrils daily. Patient not taking: Reported on 12/02/2015 06/28/15 10/19/18  Leo Grosser, MD    Family History No family history on file.  Social History Social History   Tobacco Use  . Smoking status: Never Smoker  . Smokeless tobacco: Never Used  Substance Use Topics  . Alcohol use: No  . Drug use: No     Allergies   Patient has no known allergies.   Review of Systems Review of Systems  Neurological: Positive for headaches.  All other systems reviewed and are negative.    Physical Exam Updated Vital Signs BP (!) 147/96 (BP Location: Right Arm)   Pulse 86   Temp 98 F  (36.7 C) (Oral)   Resp 14   Ht 6\' 1"  (1.854 m)   Wt 113.4 kg   SpO2 98%   BMI 32.98 kg/m   Physical Exam Vitals signs and nursing note reviewed.  Constitutional:      Appearance: He is well-developed.  HENT:     Head: Normocephalic and atraumatic.     Mouth/Throat:     Mouth: Mucous membranes are moist.     Pharynx: Oropharynx is clear.  Eyes:     Extraocular Movements: Extraocular movements intact.     Pupils: Pupils are equal, round, and reactive to light.  Neck:     Musculoskeletal: Normal range of motion and neck supple.  Cardiovascular:     Rate and Rhythm: Normal rate and regular rhythm.     Heart sounds: Normal heart sounds.  Pulmonary:     Effort: Pulmonary effort is normal.     Breath sounds: Normal breath sounds.  Abdominal:     General: Bowel sounds are normal.     Palpations: Abdomen is soft.  Musculoskeletal: Normal range of motion.  Skin:    General: Skin is warm and dry.     Capillary Refill: Capillary refill takes less than 2 seconds.  Neurological:     Mental Status: He is alert and oriented to person, place,  and time.  Psychiatric:        Mood and Affect: Mood normal.        Speech: Speech normal.        Behavior: Behavior normal.      ED Treatments / Results  Labs (all labs ordered are listed, but only abnormal results are displayed) Labs Reviewed  BASIC METABOLIC PANEL - Abnormal; Notable for the following components:      Result Value   Glucose, Bld 108 (*)    All other components within normal limits  CBC    EKG None  Radiology Ct Head Wo Contrast  Result Date: 11/14/2018 CLINICAL DATA:  Headache. EXAM: CT HEAD WITHOUT CONTRAST TECHNIQUE: Contiguous axial images were obtained from the base of the skull through the vertex without intravenous contrast. COMPARISON:  None. FINDINGS: Brain: No evidence of acute infarction, hemorrhage, hydrocephalus, extra-axial collection or mass lesion/mass effect. Vascular: No hyperdense vessel or  unexpected calcification. Skull: Normal. Negative for fracture or focal lesion. Sinuses/Orbits: No acute finding. Other: None. IMPRESSION: Normal head CT. Electronically Signed   By: Lupita RaiderJames  Green Jr M.D.   On: 11/14/2018 12:53    Procedures Procedures (including critical care time)  Medications Ordered in ED Medications  sodium chloride 0.9 % bolus 1,000 mL ( Intravenous Restarted 11/14/18 1320)    And  0.9 %  sodium chloride infusion (has no administration in time range)  ketorolac (TORADOL) 15 MG/ML injection 15 mg (15 mg Intravenous Given 11/14/18 1256)     Initial Impression / Assessment and Plan / ED Course  I have reviewed the triage vital signs and the nursing notes.  Pertinent labs & imaging results that were available during my care of the patient were reviewed by me and considered in my medical decision making (see chart for details).       Pt is feeling much better.  He is instructed to f/u with neurology if headaches return.  Return if worse.  Final Clinical Impressions(s) / ED Diagnoses   Final diagnoses:  Acute nonintractable headache, unspecified headache type    ED Discharge Orders         Ordered    celecoxib (CELEBREX) 200 MG capsule  2 times daily     11/14/18 1339           Jacalyn LefevreHaviland, Aly Seidenberg, MD 11/14/18 1341

## 2018-11-19 ENCOUNTER — Ambulatory Visit (INDEPENDENT_AMBULATORY_CARE_PROVIDER_SITE_OTHER): Payer: 59 | Admitting: Diagnostic Neuroimaging

## 2018-11-19 ENCOUNTER — Other Ambulatory Visit: Payer: Self-pay

## 2018-11-19 ENCOUNTER — Encounter: Payer: Self-pay | Admitting: Diagnostic Neuroimaging

## 2018-11-19 DIAGNOSIS — G4489 Other headache syndrome: Secondary | ICD-10-CM | POA: Diagnosis not present

## 2018-11-19 MED ORDER — VERAPAMIL HCL ER 120 MG PO CP24: 120.0000 mg | ORAL_CAPSULE | Freq: Every day | ORAL | 6 refills | Status: DC

## 2018-11-19 MED ORDER — SUMATRIPTAN SUCCINATE 100 MG PO TABS: 100.0000 mg | ORAL_TABLET | Freq: Once | ORAL | 6 refills | Status: DC | PRN

## 2018-11-19 NOTE — Patient Instructions (Signed)
-   MRI brain  - verapamil 120mg  daily  - sumatriptan 100mg  as needed; max 2 tabs per day or 8 tabs per month

## 2018-11-19 NOTE — Progress Notes (Signed)
[facility]  PATIENT: David Wilkerson DOB: 04/04/1991  REFERRING CLINICIAN: ER  HISTORY FROM: patient  REASON FOR VISIT: new consult    HISTORICAL  CHIEF COMPLAINT:  Chief Complaint  Patient presents with  . Headache    rm 6 New Pt, "headaches x 1 month"    HISTORY OF PRESENT ILLNESS:   28 year old male here for evaluation headaches.  Patient has had intermittent headaches since 2014..  Was diagnosed with cluster headaches.  Current flareup of headaches has been going on for past 1 month.  He describes unilateral severe sharp headaches, throbbing sensation, lasting hours at a time.  He denies nausea or vomiting, sensitive to light or sound.  No tearing, runny nose or abnormal sensation on face.  He has been to the emergency room multiple times, been treated with nonrebreather oxygen with immediate resolution of headaches.  No specific triggering factors.  No change in diet, stress, exercise, caffeine, alcohol.  He is tried ibuprofen and Tylenol without relief.  His brother and mother have similar headaches.   REVIEW OF SYSTEMS: Full 14 system review of systems performed and negative with exception of: As per HPI.  ALLERGIES: No Known Allergies  HOME MEDICATIONS: Outpatient Medications Prior to Visit  Medication Sig Dispense Refill  . celecoxib (CELEBREX) 200 MG capsule Take 1 capsule (200 mg total) by mouth 2 (two) times daily. 60 capsule 0  . hydrocortisone 2.5 % cream Apply to lesions twice daily. (Patient not taking: Reported on 11/19/2018) 28 g 0   No facility-administered medications prior to visit.     PAST MEDICAL HISTORY: Past Medical History:  Diagnosis Date  . Headache     PAST SURGICAL HISTORY: No past surgical history on file.  FAMILY HISTORY: Family History  Problem Relation Age of Onset  . Diabetes Sister     SOCIAL HISTORY: Social History   Socioeconomic History  . Marital status: Single    Spouse name: Not on file  .  Number of children: 0  . Years of education: Not on file  . Highest education level: High school graduate  Occupational History  . Not on file  Social Needs  . Financial resource strain: Not on file  . Food insecurity    Worry: Not on file    Inability: Not on file  . Transportation needs    Medical: Not on file    Non-medical: Not on file  Tobacco Use  . Smoking status: Never Smoker  . Smokeless tobacco: Never Used  Substance and Sexual Activity  . Alcohol use: No  . Drug use: No  . Sexual activity: Not on file  Lifestyle  . Physical activity    Days per week: Not on file    Minutes per session: Not on file  . Stress: Not on file  Relationships  . Social Musicianconnections    Talks on phone: Not on file    Gets together: Not on file    Attends religious service: Not on file    Active member of club or organization: Not on file    Attends meetings of clubs or organizations: Not on file    Relationship status: Not on file  . Intimate partner violence    Fear of current or ex partner: Not on file    Emotionally abused: Not on file    Physically abused: Not on file    Forced sexual activity: Not on file  Other Topics Concern  . Not on file  Social History  Narrative   Lives alone   Caffeine- very little     PHYSICAL EXAM  GENERAL EXAM/CONSTITUTIONAL: Vitals:  Vitals:   11/19/18 1506  BP: (!) 162/112  Pulse: 82  Temp: (!) 96.6 F (35.9 C)  Weight: (!) 324 lb (147 kg)  Height: 6\' 1"  (1.854 m)     Body mass index is 42.75 kg/m. Wt Readings from Last 3 Encounters:  11/19/18 (!) 324 lb (147 kg)  11/14/18 250 lb (113.4 kg)  10/19/18 250 lb (113.4 kg)     Patient is in no distress; well developed, nourished and groomed; neck is supple  CARDIOVASCULAR:  Examination of carotid arteries is normal; no carotid bruits  Regular rate and rhythm, no murmurs  Examination of peripheral vascular system by observation and palpation is normal  EYES:  Ophthalmoscopic  exam of optic discs and posterior segments is normal; no papilledema or hemorrhages  No exam data present  MUSCULOSKELETAL:  Gait, strength, tone, movements noted in Neurologic exam below  NEUROLOGIC: MENTAL STATUS:  No flowsheet data found.  awake, alert, oriented to person, place and time  recent and remote memory intact  normal attention and concentration  language fluent, comprehension intact, naming intact  fund of knowledge appropriate  CRANIAL NERVE:   2nd - no papilledema on fundoscopic exam  2nd, 3rd, 4th, 6th - pupils equal and reactive to light, visual fields full to confrontation, extraocular muscles intact, no nystagmus  5th - facial sensation symmetric  7th - facial strength symmetric  8th - hearing intact  9th - palate elevates symmetrically, uvula midline  11th - shoulder shrug symmetric  12th - tongue protrusion midline  MOTOR:   normal bulk and tone, full strength in the BUE, BLE  SENSORY:   normal and symmetric to light touch, temperature, vibration  COORDINATION:   finger-nose-finger, fine finger movements normal  REFLEXES:   deep tendon reflexes present and symmetric  GAIT/STATION:   narrow based gait     DIAGNOSTIC DATA (LABS, IMAGING, TESTING) - I reviewed patient records, labs, notes, testing and imaging myself where available.  Lab Results  Component Value Date   WBC 6.3 11/14/2018   HGB 14.8 11/14/2018   HCT 45.9 11/14/2018   MCV 84.1 11/14/2018   PLT 343 11/14/2018      Component Value Date/Time   NA 139 11/14/2018 1232   K 3.7 11/14/2018 1232   CL 105 11/14/2018 1232   CO2 27 11/14/2018 1232   GLUCOSE 108 (H) 11/14/2018 1232   BUN 11 11/14/2018 1232   CREATININE 1.09 11/14/2018 1232   CALCIUM 9.1 11/14/2018 1232   GFRNONAA >60 11/14/2018 1232   GFRAA >60 11/14/2018 1232   No results found for: CHOL, HDL, LDLCALC, LDLDIRECT, TRIG, CHOLHDL No results found for: ZOXW9UHGBA1C No results found for: VITAMINB12  No results found for: TSH   11/14/18 CT head [I reviewed images myself and agree with interpretation. -VRP]  - negative   ASSESSMENT AND PLAN  28 y.o. year old male here with severe intermittent headaches, lasting hours at a time, with some features for cluster headache but somewhat atypical.  We will proceed with further work-up to rule out secondary causes.  Ddx: cluster HA vs other secondary cause  1. Other headache syndrome     PLAN:  - MRI brain - verapamil 120mg  daily; monitor BP at home - sumatriptan 100mg  as needed; max 2 tabs per day or 8 tabs per month  Orders Placed This Encounter  Procedures  . MR  BRAIN W WO CONTRAST   Meds ordered this encounter  Medications  . verapamil (VERELAN PM) 120 MG 24 hr capsule    Sig: Take 1 capsule (120 mg total) by mouth at bedtime.    Dispense:  30 capsule    Refill:  6  . SUMAtriptan (IMITREX) 100 MG tablet    Sig: Take 1 tablet (100 mg total) by mouth once as needed for migraine. May repeat x 1 after 2 hours; maximum 2 tabs per day and 8 tabs per month    Dispense:  8 tablet    Refill:  6   Return in about 3 months (around 02/19/2019).    Penni Bombard, MD 3/95/3202, 3:34 PM Certified in Neurology, Neurophysiology and Neuroimaging  [facility] [address] [place], [place] [address] [phone]

## 2018-11-21 ENCOUNTER — Telehealth: Payer: Self-pay | Admitting: Diagnostic Neuroimaging

## 2018-11-21 NOTE — Telephone Encounter (Signed)
no to the covid-19 questions MR Brain w/wo contrast Dr. Colen Darling [facility] Auth: [id number] (exp. 11/21/18 to 01/05/19). Patient is scheduled at [facility] for 11/27/18

## 2018-11-27 ENCOUNTER — Other Ambulatory Visit: Payer: 59

## 2018-12-04 ENCOUNTER — Other Ambulatory Visit: Payer: 59

## 2018-12-05 ENCOUNTER — Other Ambulatory Visit: Payer: Self-pay | Admitting: *Deleted

## 2018-12-05 DIAGNOSIS — G4489 Other headache syndrome: Secondary | ICD-10-CM

## 2018-12-05 NOTE — Telephone Encounter (Signed)
Patient called and stated he does have the money now.. when you get a chance can you put a new MRI order in for me to be able to schedule. Thank you.

## 2018-12-05 NOTE — Telephone Encounter (Signed)
Patient called wanted to r/s his appt from yesterday 12/04/18. But since he did not have the money for it because he does not have a job and he got denied for his disability he stated he will call back later to r/s.

## 2018-12-18 ENCOUNTER — Other Ambulatory Visit: Payer: 59

## 2018-12-19 ENCOUNTER — Encounter: Payer: Self-pay | Admitting: Diagnostic Neuroimaging

## 2019-02-26 ENCOUNTER — Ambulatory Visit: Payer: 59 | Admitting: Diagnostic Neuroimaging

## 2019-02-26 ENCOUNTER — Telehealth: Payer: Self-pay | Admitting: *Deleted

## 2019-02-26 NOTE — Telephone Encounter (Signed)
Patient was no show for follow up today. 

## 2020-04-06 ENCOUNTER — Encounter (HOSPITAL_BASED_OUTPATIENT_CLINIC_OR_DEPARTMENT_OTHER): Payer: Self-pay | Admitting: Emergency Medicine

## 2020-04-06 ENCOUNTER — Other Ambulatory Visit: Payer: Self-pay

## 2020-04-06 DIAGNOSIS — N61 Mastitis without abscess: Secondary | ICD-10-CM | POA: Diagnosis not present

## 2020-04-06 DIAGNOSIS — N644 Mastodynia: Secondary | ICD-10-CM | POA: Diagnosis present

## 2020-04-06 NOTE — ED Triage Notes (Signed)
Patient presents with complaints of redness and swelling noted around right nipple area. States onset yesterday; denies any drainage. Denies fever.

## 2020-04-07 ENCOUNTER — Emergency Department (HOSPITAL_BASED_OUTPATIENT_CLINIC_OR_DEPARTMENT_OTHER)
Admission: EM | Admit: 2020-04-07 | Discharge: 2020-04-07 | Disposition: A | Discharge status: Home / Self Care | Payer: 59 | Attending: Emergency Medicine | Admitting: Emergency Medicine

## 2020-04-07 DIAGNOSIS — N61 Mastitis without abscess: Secondary | ICD-10-CM

## 2020-04-07 MED ORDER — DOXYCYCLINE HYCLATE 100 MG PO TABS
100.0000 mg | ORAL_TABLET | Freq: Once | ORAL | Status: AC
  Administered 2020-04-07: 100 mg via ORAL
  Filled 2020-04-07: qty 1

## 2020-04-07 MED ORDER — DOXYCYCLINE HYCLATE 100 MG PO CAPS: 100.0000 mg | ORAL_CAPSULE | Freq: Two times a day (BID) | ORAL | 0 refills | Status: DC

## 2020-04-07 NOTE — ED Notes (Signed)
Right breast is red and swollen

## 2020-04-07 NOTE — ED Provider Notes (Signed)
[facility] Provider Note: David Dell, MD, FACEP  CSN: [id number] MRN: [id number] ARRIVAL: 04/06/20 at 2252 ROOM: [facility]03/[facility]03   CHIEF COMPLAINT  Abscess   HISTORY OF PRESENT ILLNESS  04/07/20 1:04 AM David Wilkerson is a 29 y.o. male with redness and swelling around the right nipple.  This began 2 days ago.  There has been no drainage.  He rates associated pain is a 1-2 out of 10, worse with palpation.    Past Medical History:  Diagnosis Date  . Headache     History reviewed. No pertinent surgical history.  Family History  Problem Relation Age of Onset  . Diabetes Sister     Social History   Tobacco Use  . Smoking status: Never Smoker  . Smokeless tobacco: Never Used  Vaping Use  . Vaping Use: Never used  Substance Use Topics  . Alcohol use: No  . Drug use: No    Prior to Admission medications   Medication Sig Start Date End Date Taking? Authorizing Provider  doxycycline (VIBRAMYCIN) 100 MG capsule Take 1 capsule (100 mg total) by mouth 2 (two) times daily. One po bid x 7 days 04/07/20   Kamari Bilek, MD  fluticasone (FLONASE) 50 MCG/ACT nasal spray Place 2 sprays into both nostrils daily. Patient not taking: Reported on 12/02/2015 06/28/15 10/19/18  Lyndal Pulley, MD  SUMAtriptan (IMITREX) 100 MG tablet Take 1 tablet (100 mg total) by mouth once as needed for migraine. May repeat x 1 after 2 hours; maximum 2 tabs per day and 8 tabs per month 11/19/18 04/07/20  Penumalli, Glenford Bayley, MD  verapamil (VERELAN PM) 120 MG 24 hr capsule Take 1 capsule (120 mg total) by mouth at bedtime. 11/19/18 04/07/20  Penumalli, Glenford Bayley, MD    Allergies Patient has no known allergies.   REVIEW OF SYSTEMS  Negative except as noted here or in the History of Present Illness.   PHYSICAL EXAMINATION  Initial Vital Signs Blood pressure (!) 160/110, pulse 99, temperature 98.6 F (37 C), temperature source Oral, resp. rate 18, height 6\' 1"  (1.854 m), weight (!) 147 kg,  SpO2 97 %.  Examination General: Well-developed, well-nourished male in no acute distress; appearance consistent with age of record HENT: normocephalic; atraumatic Eyes: Normal appearance Neck: supple Heart: regular rate and rhythm Lungs: clear to auscultation bilaterally Abdomen: soft; nondistended; nontender; bowel sounds present Extremities: No deformity; full range of motion Neurologic: Awake, alert and oriented; motor function intact in all extremities and symmetric; no facial droop Skin: Warm and dry; erythema, tenderness and induration around right nipple without fluctuance or pointing:    Psychiatric: Normal mood and affect   RESULTS  Summary of this visit's results, reviewed and interpreted by myself:   EKG Interpretation  Date/Time:    Ventricular Rate:    PR Interval:    QRS Duration:   QT Interval:    QTC Calculation:   R Axis:     Text Interpretation:        Laboratory Studies: No results found for this or any previous visit (from the past 24 hour(s)). Imaging Studies: No results found.  ED COURSE and MDM  Nursing notes, initial and subsequent vitals signs, including pulse oximetry, reviewed and interpreted by myself.  Vitals:   04/06/20 2311 04/06/20 2315  BP:  (!) 160/110  Pulse:  99  Resp:  18  Temp:  98.6 F (37 C)  TempSrc:  Oral  SpO2:  97%  Weight: (!) 147 kg  Height: 6\' 1"  (1.854 m)    Medications  doxycycline (VIBRA-TABS) tablet 100 mg (has no administration in time range)    I do not believe I&D is indicated at this time as the lesion appears early without fluctuance.  We will start on doxycycline and have him return in about 3 days if symptoms are worsening.  PROCEDURES  Procedures   ED DIAGNOSES     ICD-10-CM   1. Mastitis in male  N61.0        Kenitra Leventhal, , MD 04/07/20 0110

## 2020-05-14 ENCOUNTER — Emergency Department (HOSPITAL_BASED_OUTPATIENT_CLINIC_OR_DEPARTMENT_OTHER)
Admission: EM | Admit: 2020-05-14 | Discharge: 2020-05-14 | Disposition: A | Discharge status: Home / Self Care | Payer: 59 | Attending: Emergency Medicine | Admitting: Emergency Medicine

## 2020-05-14 ENCOUNTER — Other Ambulatory Visit: Payer: Self-pay

## 2020-05-14 ENCOUNTER — Encounter (HOSPITAL_BASED_OUTPATIENT_CLINIC_OR_DEPARTMENT_OTHER): Payer: Self-pay | Admitting: Emergency Medicine

## 2020-05-14 DIAGNOSIS — U071 COVID-19: Secondary | ICD-10-CM | POA: Diagnosis not present

## 2020-05-14 DIAGNOSIS — N179 Acute kidney failure, unspecified: Secondary | ICD-10-CM | POA: Insufficient documentation

## 2020-05-14 DIAGNOSIS — E860 Dehydration: Secondary | ICD-10-CM | POA: Insufficient documentation

## 2020-05-14 DIAGNOSIS — R509 Fever, unspecified: Secondary | ICD-10-CM | POA: Diagnosis present

## 2020-05-14 DIAGNOSIS — J069 Acute upper respiratory infection, unspecified: Secondary | ICD-10-CM

## 2020-05-14 LAB — BASIC METABOLIC PANEL
Anion gap: 10 (ref 5–15)
BUN: 14 mg/dL (ref 6–20)
CO2: 23 mmol/L (ref 22–32)
Calcium: 8.8 mg/dL — ABNORMAL LOW (ref 8.9–10.3)
Chloride: 105 mmol/L (ref 98–111)
Creatinine, Ser: 1.52 mg/dL — ABNORMAL HIGH (ref 0.61–1.24)
GFR, Estimated: >60 mL/min (ref >60)
Glucose, Bld: 103 mg/dL — ABNORMAL HIGH (ref 70–99)
Potassium: 3.3 mmol/L — ABNORMAL LOW (ref 3.5–5.1)
Sodium: 138 mmol/L (ref 135–145)

## 2020-05-14 LAB — CBC WITH DIFFERENTIAL/PLATELET
Abs Immature Granulocytes: 0.02 10*3/uL (ref 0.00–0.07)
Basophils Absolute: 0 10*3/uL (ref 0.0–0.1)
Basophils Relative: 1 %
Eosinophils Absolute: 0 10*3/uL (ref 0.0–0.5)
Eosinophils Relative: 0 %
HCT: 42.1 % (ref 39.0–52.0)
Hemoglobin: 14 g/dL (ref 13.0–17.0)
Immature Granulocytes: 0 %
Lymphocytes Relative: 15 %
Lymphs Abs: 1 10*3/uL (ref 0.7–4.0)
MCH: 27.3 pg (ref 26.0–34.0)
MCHC: 33.3 g/dL (ref 30.0–36.0)
MCV: 82.2 fL (ref 80.0–100.0)
Monocytes Absolute: 0.8 10*3/uL (ref 0.1–1.0)
Monocytes Relative: 12 %
Neutro Abs: 4.8 10*3/uL (ref 1.7–7.7)
Neutrophils Relative %: 72 %
Platelets: 296 10*3/uL (ref 150–400)
RBC: 5.12 MIL/uL (ref 4.22–5.81)
RDW: 13.6 % (ref 11.5–15.5)
WBC: 6.6 10*3/uL (ref 4.0–10.5)
nRBC: 0 % (ref 0.0–0.2)

## 2020-05-14 LAB — SARS CORONAVIRUS 2 (TAT 6-24 HRS): SARS Coronavirus 2: POSITIVE — AB

## 2020-05-14 MED ORDER — ACETAMINOPHEN 500 MG PO TABS
1000.0000 mg | ORAL_TABLET | Freq: Once | ORAL | Status: AC
  Administered 2020-05-14: 1000 mg via ORAL
  Filled 2020-05-14: qty 2

## 2020-05-14 MED ORDER — LACTATED RINGERS IV BOLUS
1000.0000 mL | Freq: Once | INTRAVENOUS | Status: AC
Start: 2020-05-14 — End: 2020-05-14
  Administered 2020-05-14: 1000 mL via INTRAVENOUS

## 2020-05-14 MED ORDER — KETOROLAC TROMETHAMINE 30 MG/ML IJ SOLN
30.0000 mg | Freq: Once | INTRAMUSCULAR | Status: AC
  Administered 2020-05-14: 30 mg via INTRAVENOUS
  Filled 2020-05-14: qty 1

## 2020-05-14 NOTE — ED Triage Notes (Signed)
Pt states last night at work he started having headache, fever, cough, chills

## 2020-05-14 NOTE — ED Provider Notes (Signed)
[facility] EMERGENCY DEPARTMENT Provider Note   CSN: [id number] Arrival date & time: 05/14/20  0456     History Chief Complaint  Patient presents with  . covid symptoms    David Wilkerson is a 30 y.o. male.  Relatively cute onset of fever, headaches, body aches, nausea, generalized malaise approximately 8 hours prior to arrival.  Patient states he works in [employer] but does not know if he wants been sick.  No known sick contacts.  He lives with his brother who had a cough for the last week or so.  Patient states that before this he was in his normal health.  Patient states that he was not vaccinated for COVID he is actually scheduled to get that tomorrow.  No GI symptoms.  No other associated symptoms.  Patient tried to take culture but it did not.  Did not try Tylenol or ibuprofen.  No other attempted mitigating factors.        Past Medical History:  Diagnosis Date  . Headache     There are no problems to display for this patient.   History reviewed. No pertinent surgical history.     Family History  Problem Relation Age of Onset  . Diabetes Sister     Social History   Tobacco Use  . Smoking status: Never Smoker  . Smokeless tobacco: Never Used  Vaping Use  . Vaping Use: Never used  Substance Use Topics  . Alcohol use: No  . Drug use: No    Home Medications Prior to Admission medications   Medication Sig Start Date End Date Taking? Authorizing Provider  doxycycline (VIBRAMYCIN) 100 MG capsule Take 1 capsule (100 mg total) by mouth 2 (two) times daily. One po bid x 7 days 04/07/20   Molpus, John, MD  fluticasone (FLONASE) 50 MCG/ACT nasal spray Place 2 sprays into both nostrils daily. Patient not taking: Reported on 12/02/2015 06/28/15 10/19/18  Lyndal Pulley, MD  SUMAtriptan (IMITREX) 100 MG tablet Take 1 tablet (100 mg total) by mouth once as needed for migraine. May repeat x 1 after 2 hours; maximum 2 tabs per day and 8 tabs per month  11/19/18 04/07/20  Penumalli, Glenford Bayley, MD  verapamil (VERELAN PM) 120 MG 24 hr capsule Take 1 capsule (120 mg total) by mouth at bedtime. 11/19/18 04/07/20  Penumalli, Glenford Bayley, MD    Allergies    Patient has no known allergies.  Review of Systems   Review of Systems  All other systems reviewed and are negative.   Physical Exam Updated Vital Signs BP 116/76 (BP Location: Right Arm)   Pulse 100   Temp 99.4 F (37.4 C) (Oral)   Resp 16   Ht 5\' 11"  (1.803 m)   Wt 120.2 kg   SpO2 99%   BMI 36.96 kg/m   Physical Exam Vitals and nursing note reviewed.  Constitutional:      Appearance: He is well-developed and well-nourished.  HENT:     Head: Normocephalic and atraumatic.     Nose: No congestion or rhinorrhea.     Mouth/Throat:     Mouth: Mucous membranes are moist.     Pharynx: Oropharynx is clear.  Eyes:     Pupils: Pupils are equal, round, and reactive to light.  Cardiovascular:     Rate and Rhythm: Tachycardia present.  Pulmonary:     Effort: Pulmonary effort is normal. No respiratory distress.  Abdominal:     General: There is no distension.  Musculoskeletal:        General: Normal range of motion.     Cervical back: Normal range of motion.  Skin:    General: Skin is warm and dry.  Neurological:     General: No focal deficit present.     Mental Status: He is alert.     ED Results / Procedures / Treatments   Labs (all labs ordered are listed, but only abnormal results are displayed) Labs Reviewed  BASIC METABOLIC PANEL - Abnormal; Notable for the following components:      Result Value   Potassium 3.3 (*)    Glucose, Bld 103 (*)    Creatinine, Ser 1.52 (*)    Calcium 8.8 (*)    All other components within normal limits  SARS CORONAVIRUS 2 (TAT 6-24 HRS)  CBC WITH DIFFERENTIAL/PLATELET    EKG None  Radiology No results found.  Procedures Procedures (including critical care time)  Medications Ordered in ED Medications  ketorolac (TORADOL) 30  MG/ML injection 30 mg (30 mg Intravenous Given 05/14/20 0550)  lactated ringers bolus 1,000 mL (0 mLs Intravenous Stopped 05/14/20 0629)  acetaminophen (TYLENOL) tablet 1,000 mg (1,000 mg Oral Given 05/14/20 0549)    ED Course  I have reviewed the triage vital signs and the nursing notes.  Pertinent labs & imaging results that were available during my care of the patient were reviewed by me and considered in my medical decision making (see chart for details).    MDM Rules/Calculators/A&P                          Covid, flu or some other flu like illness. With his level of tachycardia, will eval for AKI. Obviously dehydrated, will hydrate at same time.  Given fluids here. Does have likely AKI, encouraged PCP follow up to evaluate for improvement. Tolerating PO, suspect he will improve further at home. Appears stable for discharge.   Final Clinical Impression(s) / ED Diagnoses Final diagnoses:  Dehydration  Viral URI  AKI (acute kidney injury) [facility])    Rx / DC Orders ED Discharge Orders    None       Aashna Matson, Barbara Cower, MD 05/14/20 2324

## 2020-12-03 ENCOUNTER — Other Ambulatory Visit: Payer: Self-pay

## 2020-12-03 ENCOUNTER — Encounter (HOSPITAL_BASED_OUTPATIENT_CLINIC_OR_DEPARTMENT_OTHER): Payer: Self-pay | Admitting: *Deleted

## 2020-12-03 ENCOUNTER — Emergency Department (HOSPITAL_BASED_OUTPATIENT_CLINIC_OR_DEPARTMENT_OTHER)
Admission: EM | Admit: 2020-12-03 | Discharge: 2020-12-04 | Disposition: A | Discharge status: Home / Self Care | Payer: 59 | Attending: Emergency Medicine | Admitting: Emergency Medicine

## 2020-12-03 DIAGNOSIS — Z28310 Unvaccinated for covid-19: Secondary | ICD-10-CM | POA: Diagnosis not present

## 2020-12-03 DIAGNOSIS — I10 Essential (primary) hypertension: Secondary | ICD-10-CM | POA: Diagnosis not present

## 2020-12-03 DIAGNOSIS — Z79899 Other long term (current) drug therapy: Secondary | ICD-10-CM | POA: Insufficient documentation

## 2020-12-03 DIAGNOSIS — R059 Cough, unspecified: Secondary | ICD-10-CM | POA: Diagnosis present

## 2020-12-03 DIAGNOSIS — J069 Acute upper respiratory infection, unspecified: Secondary | ICD-10-CM | POA: Diagnosis not present

## 2020-12-03 DIAGNOSIS — Z20822 Contact with and (suspected) exposure to covid-19: Secondary | ICD-10-CM | POA: Diagnosis not present

## 2020-12-03 DIAGNOSIS — B974 Respiratory syncytial virus as the cause of diseases classified elsewhere: Secondary | ICD-10-CM | POA: Diagnosis not present

## 2020-12-03 DIAGNOSIS — B338 Other specified viral diseases: Secondary | ICD-10-CM

## 2020-12-03 NOTE — ED Triage Notes (Signed)
Cough , fever , body aches x 1 day

## 2020-12-04 ENCOUNTER — Other Ambulatory Visit (HOSPITAL_BASED_OUTPATIENT_CLINIC_OR_DEPARTMENT_OTHER): Payer: Self-pay

## 2020-12-04 LAB — RESP PANEL BY RT-PCR (FLU A&B, COVID) ARPGX2
Influenza A by PCR: NEGATIVE
Influenza B by PCR: NEGATIVE
SARS Coronavirus 2 by RT PCR: NEGATIVE

## 2020-12-04 LAB — GROUP A STREP BY PCR: Group A Strep by PCR: NOT DETECTED

## 2020-12-04 MED ORDER — ALBUTEROL SULFATE HFA 108 (90 BASE) MCG/ACT IN AERS
2.0000 | INHALATION_SPRAY | Freq: Once | RESPIRATORY_TRACT | Status: AC
  Administered 2020-12-04: 2 via RESPIRATORY_TRACT
  Filled 2020-12-04: qty 6.7

## 2020-12-04 MED ORDER — PREDNISONE 50 MG PO TABS
60.0000 mg | ORAL_TABLET | Freq: Once | ORAL | Status: AC
  Administered 2020-12-04: 60 mg via ORAL
  Filled 2020-12-04: qty 1

## 2020-12-04 MED ORDER — PREDNISONE 50 MG PO TABS
50.0000 mg | ORAL_TABLET | Freq: Every day | ORAL | 0 refills | Status: DC
  Filled 2020-12-04: qty 5, 5d supply, fill #0

## 2020-12-04 MED ORDER — HYDROCHLOROTHIAZIDE 25 MG PO TABS
25.0000 mg | ORAL_TABLET | Freq: Every day | ORAL | 0 refills | Status: DC
  Filled 2020-12-04: qty 30, 30d supply, fill #0

## 2020-12-04 NOTE — ED Provider Notes (Signed)
[facility] EMERGENCY DEPARTMENT Provider Note   CSN: [id number] Arrival date & time: 12/03/20  2337     History Chief Complaint  Patient presents with   Cough    David Wilkerson is a 30 y.o. male.  The history is provided by the patient.  Cough He had onset yesterday of nonproductive cough.  Today, he continues with nonproductive cough and has had since that he felt hot and.  There has been some sore throat and some rhinorrhea.  Rhinorrhea has been clear.  He denies chills but has had some sweats.  He did have his temperature checked at work and it was not elevated.  He has had some intermittent generalized body aches today.  There has been no nausea, vomiting, diarrhea.  He denies any sick contacts.  He has not been vaccinated against COVID-19.   Past Medical History:  Diagnosis Date   Headache     There are no problems to display for this patient.   History reviewed. No pertinent surgical history.     Family History  Problem Relation Age of Onset   Diabetes Sister     Social History   Tobacco Use   Smoking status: Never   Smokeless tobacco: Never  Vaping Use   Vaping Use: Never used  Substance Use Topics   Alcohol use: No   Drug use: No    Home Medications Prior to Admission medications   Medication Sig Start Date End Date Taking? Authorizing Provider  doxycycline (VIBRAMYCIN) 100 MG capsule Take 1 capsule (100 mg total) by mouth 2 (two) times daily. One po bid x 7 days 04/07/20   Molpus, John, MD  fluticasone (FLONASE) 50 MCG/ACT nasal spray Place 2 sprays into both nostrils daily. Patient not taking: Reported on 12/02/2015 06/28/15 10/19/18  Lyndal Pulley, MD  SUMAtriptan (IMITREX) 100 MG tablet Take 1 tablet (100 mg total) by mouth once as needed for migraine. May repeat x 1 after 2 hours; maximum 2 tabs per day and 8 tabs per month 11/19/18 04/07/20  Penumalli, Glenford Bayley, MD  verapamil (VERELAN PM) 120 MG 24 hr capsule Take 1 capsule (120 mg total)  by mouth at bedtime. 11/19/18 04/07/20  Penumalli, Glenford Bayley, MD    Allergies    Patient has no known allergies.  Review of Systems   Review of Systems  Respiratory:  Positive for cough.   All other systems reviewed and are negative.  Physical Exam Updated Vital Signs BP (!) 158/105 (BP Location: Left Arm)   Pulse 99   Temp 98.4 F (36.9 C) (Oral)   Resp 18   Ht 6' (1.829 m)   Wt 122.5 kg   SpO2 94%   BMI 36.62 kg/m   Physical Exam Vitals and nursing note reviewed.  30 year old male, resting comfortably and in no acute distress. Vital signs are significant for elevated blood pressure. Oxygen saturation is 94%, which is normal. Head is normocephalic and atraumatic. PERRLA, EOMI. Oropharynx is mildly erythematous without exudate.  There is no sinus tenderness. Neck is nontender and supple without adenopathy or JVD. Back is nontender and there is no CVA tenderness. Lungs are clear without rales, wheezes, or rhonchi. Chest is nontender. Heart has regular rate and rhythm without murmur. Abdomen is soft, flat, nontender without masses or hepatosplenomegaly and peristalsis is normoactive. Extremities have no cyanosis or edema, full range of motion is present. Skin is warm and dry without rash. Neurologic: Mental status is normal, cranial nerves are intact,  there are no motor or sensory deficits.  ED Results / Procedures / Treatments   Labs (all labs ordered are listed, but only abnormal results are displayed) Labs Reviewed  GROUP A STREP BY PCR  RESP PANEL BY RT-PCR (FLU A&B, COVID) [id number]    Procedures Procedures   Medications Ordered in ED Medications  predniSONE (DELTASONE) tablet 60 mg (has no administration in time range)  albuterol (VENTOLIN HFA) 108 (90 Base) MCG/ACT inhaler 2 puff (2 puffs Inhalation Given 12/04/20 0042)    ED Course  I have reviewed the triage vital signs and the nursing notes.  Pertinent lab results that were available during my care of the  patient were reviewed by me and considered in my medical decision making (see chart for details).   MDM Rules/Calculators/A&P                         Respiratory tract infection, most likely viral.  However with pharyngeal injection will check strep PCR.  We will also check respiratory pathogen panel.  Old records are reviewed, and he did have a similar presentation 2 years ago and was treated empirically for influenza.  Also, blood pressure is elevated today.  Patient states he was told that he had high blood pressure but was not placed on any medication.  On review of prior ED visits, blood pressure has been consistently elevated, will plan to start on antihypertensive medication.  Strep PCR is negative.  Respiratory pathogen panel is negative for COVID-19 and influenza but positive for RSV.  Patient is informed of these findings.  He did note some improvement in his cough with albuterol and he is advised to continue using the inhaler 2 puffs, every 4 hours as needed.  He is given a dose of prednisone and given a prescription for short course of prednisone.  Also discharged with prescription for hydrochlorothiazide.  Instructed that he needs to continue taking hydrochlorothiazide until stopped by a physician.  If he runs out before he is able to obtain a primary care provider, advised to go to an urgent care center to get prescription refilled.  Final Clinical Impression(s) / ED Diagnoses Final diagnoses:  Viral URI with cough  RSV infection  Elevated blood pressure reading with diagnosis of hypertension    Rx / DC Orders ED Discharge Orders          Ordered    predniSONE (DELTASONE) 50 MG tablet  Daily        12/04/20 0137    hydrochlorothiazide (HYDRODIURIL) 25 MG tablet  Daily        12/04/20 0137             Dione Booze, MD 12/04/20 [phone]

## 2020-12-04 NOTE — Discharge Instructions (Addendum)
You may use the inhaler as needed to help with your coughing.  Use 2 puffs of the inhaler, every 4 hours as needed.  You may take ibuprofen or acetaminophen as needed for fever or aching.  You are being given medication for your blood pressure.  Please get a blood pressure cuff and measure your blood pressure at home.  He should check it once a day and keep a record of it.  He should not take that record with you whenever you see your primary care provider.  You are being given a 1 month supply.  If you have not been able to see a primary care provider in that month, please go to an urgent care center to get new prescriptions.  You should not stop taking blood pressure medication unless you are told to do so by your primary care provider.  Blood pressure that is not adequately treated can lead to heart attacks, strokes, kidney failure.

## 2021-05-07 ENCOUNTER — Encounter (HOSPITAL_BASED_OUTPATIENT_CLINIC_OR_DEPARTMENT_OTHER): Payer: Self-pay | Admitting: Emergency Medicine

## 2021-05-07 ENCOUNTER — Emergency Department (HOSPITAL_BASED_OUTPATIENT_CLINIC_OR_DEPARTMENT_OTHER)
Admission: EM | Admit: 2021-05-07 | Discharge: 2021-05-07 | Disposition: A | Discharge status: Home / Self Care | Payer: 59 | Attending: Emergency Medicine | Admitting: Emergency Medicine

## 2021-05-07 ENCOUNTER — Other Ambulatory Visit: Payer: Self-pay

## 2021-05-07 DIAGNOSIS — Z79899 Other long term (current) drug therapy: Secondary | ICD-10-CM | POA: Insufficient documentation

## 2021-05-07 DIAGNOSIS — I10 Essential (primary) hypertension: Secondary | ICD-10-CM | POA: Insufficient documentation

## 2021-05-07 DIAGNOSIS — R030 Elevated blood-pressure reading, without diagnosis of hypertension: Secondary | ICD-10-CM | POA: Diagnosis not present

## 2021-05-07 DIAGNOSIS — R509 Fever, unspecified: Secondary | ICD-10-CM | POA: Diagnosis present

## 2021-05-07 DIAGNOSIS — Z28310 Unvaccinated for covid-19: Secondary | ICD-10-CM | POA: Insufficient documentation

## 2021-05-07 DIAGNOSIS — U071 COVID-19: Secondary | ICD-10-CM | POA: Diagnosis not present

## 2021-05-07 LAB — RESP PANEL BY RT-PCR (FLU A&B, COVID) ARPGX2
Influenza A by PCR: NEGATIVE
Influenza B by PCR: NEGATIVE
SARS Coronavirus 2 by RT PCR: POSITIVE — AB

## 2021-05-07 MED ORDER — METHOCARBAMOL 500 MG PO TABS: 1000.0000 mg | ORAL_TABLET | Freq: Two times a day (BID) | ORAL | 0 refills | Status: AC

## 2021-05-07 MED ORDER — KETOROLAC TROMETHAMINE 60 MG/2ML IM SOLN
60.0000 mg | Freq: Once | INTRAMUSCULAR | Status: AC
  Administered 2021-05-07: 21:00:00 60 mg via INTRAMUSCULAR
  Filled 2021-05-07: qty 2

## 2021-05-07 MED ORDER — NIRMATRELVIR/RITONAVIR (PAXLOVID)TABLET
3.0000 | ORAL_TABLET | Freq: Two times a day (BID) | ORAL | 0 refills | Status: AC
Start: 2021-05-07 — End: 2021-05-12

## 2021-05-07 MED ORDER — ACETAMINOPHEN 500 MG PO TABS
1000.0000 mg | ORAL_TABLET | Freq: Once | ORAL | Status: AC
  Administered 2021-05-07: 17:00:00 1000 mg via ORAL
  Filled 2021-05-07: qty 2

## 2021-05-07 MED ORDER — HYDROCHLOROTHIAZIDE 25 MG PO TABS
25.0000 mg | ORAL_TABLET | Freq: Once | ORAL | Status: AC
  Administered 2021-05-07: 21:00:00 25 mg via ORAL
  Filled 2021-05-07: qty 1

## 2021-05-07 MED ORDER — ACETAMINOPHEN 325 MG PO TABS: 650.0000 mg | ORAL_TABLET | Freq: Four times a day (QID) | ORAL | 0 refills | Status: DC | PRN

## 2021-05-07 MED ORDER — DEXTROMETHORPHAN POLISTIREX ER 30 MG/5ML PO SUER: 15.0000 mg | ORAL | 0 refills | Status: DC | PRN

## 2021-05-07 MED ORDER — HYDROCHLOROTHIAZIDE 25 MG PO TABS: 25.0000 mg | ORAL_TABLET | Freq: Every day | ORAL | 1 refills | Status: AC

## 2021-05-07 NOTE — Discharge Instructions (Addendum)
Please follow-up with your primary care doctor regarding elevated blood pressure reading, medication refills.  Return to emergency dept for any worsening worrisome symptoms.

## 2021-05-07 NOTE — ED Provider Notes (Signed)
[facility] EMERGENCY DEPARTMENT Provider Note   CSN: [id number] Arrival date & time: 05/07/21  1622     History Chief Complaint  Patient presents with   Generalized Body Aches    David Wilkerson is a 30 y.o. male.  This is a 30 y.o. male with significant medical history as below, including hypertension who presents to the ED with complaint of body aches, fevers, chills, malaise, nonproductive cough.  Reports onset of symptoms this morning.  Unsure of sick contacts.  He is not vaccinated for COVID-19.  Patient also ran out of his home antihypertensive approxi-1 month ago and is requesting refill.  No dyspnea, no nausea or vomiting.  No chest pain or abdominal pain.  Not taking medication at home for symptoms.  No change in bowel or bladder function.  He is tolerant oral intake without difficulty.  No other acute complaints offered   The history is provided by the patient. No language interpreter was used.      Past Medical History:  Diagnosis Date   Headache     There are no problems to display for this patient.   History reviewed. No pertinent surgical history.     Family History  Problem Relation Age of Onset   Diabetes Sister     Social History   Tobacco Use   Smoking status: Never   Smokeless tobacco: Never  Vaping Use   Vaping Use: Never used  Substance Use Topics   Alcohol use: No   Drug use: No    Home Medications Prior to Admission medications   Medication Sig Start Date End Date Taking? Authorizing Provider  acetaminophen (TYLENOL) 325 MG tablet Take 2 tablets (650 mg total) by mouth every 6 (six) hours as needed. 05/07/21  Yes Jeanell Sparrow, DO  dextromethorphan (DELSYM) 30 MG/5ML liquid Take 2.5 mLs (15 mg total) by mouth as needed for cough. 05/07/21  Yes Wynona Dove A, DO  hydrochlorothiazide (HYDRODIURIL) 25 MG tablet Take 1 tablet (25 mg total) by mouth daily. 05/07/21  Yes Wynona Dove A, DO  methocarbamol (ROBAXIN) 500 MG tablet  Take 2 tablets (1,000 mg total) by mouth 2 (two) times daily for 5 days. 05/07/21 05/12/21 Yes Jeanell Sparrow, DO  nirmatrelvir/ritonavir EUA (PAXLOVID) 20 x 150 MG & 10 x 100MG  TABS Take 3 tablets by mouth 2 (two) times daily for 5 days. Take nirmatrelvir (150 mg) two tablets twice daily for 5 days and ritonavir (100 mg) one tablet twice daily for 5 days. 05/07/21 05/12/21 Yes Wynona Dove A, DO  hydrochlorothiazide (HYDRODIURIL) 25 MG tablet Take 1 tablet (25 mg total) by mouth daily. [id number]   Delora Fuel, MD  predniSONE (DELTASONE) 50 MG tablet Take 1 tablet (50 mg total) by mouth daily. [id number]   Delora Fuel, MD  fluticasone [facility]) 50 MCG/ACT nasal spray Place 2 sprays into both nostrils daily. Patient not taking: Reported on 12/02/2015 06/28/15 10/19/18  Leo Grosser, MD  SUMAtriptan (IMITREX) 100 MG tablet Take 1 tablet (100 mg total) by mouth once as needed for migraine. May repeat x 1 after 2 hours; maximum 2 tabs per day and 8 tabs per month 11/19/18 04/07/20  Penumalli, Earlean Polka, MD  verapamil (VERELAN PM) 120 MG 24 hr capsule Take 1 capsule (120 mg total) by mouth at bedtime. 11/19/18 04/07/20  Penumalli, Earlean Polka, MD    Allergies    Patient has no known allergies.  Review of Systems   Review of Systems  Constitutional:  Positive  for chills and fever.  HENT:  Negative for facial swelling and trouble swallowing.   Eyes:  Negative for photophobia and visual disturbance.  Respiratory:  Positive for cough. Negative for shortness of breath.   Cardiovascular:  Negative for chest pain and palpitations.  Gastrointestinal:  Negative for abdominal pain, nausea and vomiting.  Endocrine: Negative for polydipsia and polyuria.  Genitourinary:  Negative for difficulty urinating and hematuria.  Musculoskeletal:  Positive for arthralgias and myalgias. Negative for gait problem and joint swelling.  Skin:  Negative for pallor and rash.  Neurological:  Negative for syncope and headaches.   Psychiatric/Behavioral:  Negative for agitation and confusion.    Physical Exam Updated Vital Signs BP (!) 172/99 (BP Location: Right Arm)    Pulse (!) 106    Temp 98.6 F (37 C) (Oral)    Resp 18    Ht 6' (1.829 m)    Wt 120.2 kg    SpO2 95%    BMI 35.94 kg/m   Physical Exam Vitals and nursing note reviewed.  Constitutional:      General: He is not in acute distress.    Appearance: He is well-developed.  HENT:     Head: Normocephalic and atraumatic.     Right Ear: External ear normal.     Left Ear: External ear normal.     Mouth/Throat:     Mouth: Mucous membranes are moist.  Eyes:     General: No scleral icterus. Cardiovascular:     Rate and Rhythm: Normal rate and regular rhythm.     Pulses: Normal pulses.     Heart sounds: Normal heart sounds.  Pulmonary:     Effort: Pulmonary effort is normal. No respiratory distress.     Breath sounds: Normal breath sounds.  Abdominal:     General: Abdomen is flat.     Palpations: Abdomen is soft.     Tenderness: There is no abdominal tenderness. There is no right CVA tenderness, left CVA tenderness, guarding or rebound. Negative signs include Murphy's sign.  Musculoskeletal:        General: Normal range of motion.     Cervical back: Full passive range of motion without pain and normal range of motion.     Right lower leg: No edema.     Left lower leg: No edema.  Skin:    General: Skin is warm and dry.     Capillary Refill: Capillary refill takes less than 2 seconds.  Neurological:     Mental Status: He is alert and oriented to person, place, and time.  Psychiatric:        Mood and Affect: Mood normal.        Behavior: Behavior normal.    ED Results / Procedures / Treatments   Labs (all labs ordered are listed, but only abnormal results are displayed) Labs Reviewed  RESP PANEL BY RT-PCR (FLU A&B, COVID) ARPGX2 - Abnormal; Notable for the following components:      Result Value   SARS Coronavirus 2 by RT PCR POSITIVE (*)     All other components within normal limits    EKG None  Radiology No results found.  Procedures Procedures   Medications Ordered in ED Medications  hydrochlorothiazide (HYDRODIURIL) tablet 25 mg (has no administration in time range)  ketorolac (TORADOL) injection 60 mg (has no administration in time range)  acetaminophen (TYLENOL) tablet 1,000 mg (1,000 mg Oral Given 05/07/21 1639)    ED Course  I have reviewed the triage  vital signs and the nursing notes.  Pertinent labs & imaging results that were available during my care of the patient were reviewed by me and considered in my medical decision making (see chart for details).    MDM Rules/Calculators/A&P                          CC: Body aches fever chills  This patient complains of above; this involves an extensive number of treatment options and is a complaint that carries with it a high risk of complications and morbidity. Vital signs were reviewed. Serious etiologies considered.  Record review:  Previous records obtained and reviewed   Work up as above, notable for:  Labs results that were available during my care of the patient were reviewed by me and considered in my medical decision making.    Management: Patient found a positive or COVID-19.  He is not vaccinated.  He is not hypoxic, fever has resolved after Tylenol.  No acute respiratory distress.  He is breathing comfortably on room air.  No conversational dyspnea.  Overall he is well-appearing, nontoxic.  Patient with elevated blood pressure eating, has been out of his antihypertensives for proxy 1 month.  Will give refill on antihypertensives.  Discussed antiviral treatment/Paxlovid patient.  He is interested in starting this.  Sent to his pharmacy.  Also sent refill on his antihypertensive.  Symptomatic medications to help with COVID-19 symptoms.  Discussed strict return precautions and work restrictions.  The patient improved significantly and was  discharged in stable condition. Detailed discussions were had with the patient regarding current findings, and need for close f/u with PCP or on call doctor. The patient has been instructed to return immediately if the symptoms worsen in any way for re-evaluation. Patient verbalized understanding and is in agreement with current care plan. All questions answered prior to discharge.           This chart was dictated using voice recognition software.  Despite best efforts to proofread,  errors can occur which can change the documentation meaning.    Final Clinical Impression(s) / ED Diagnoses Final diagnoses:  COVID-19  Elevated blood pressure reading    Rx / DC Orders ED Discharge Orders          Ordered    hydrochlorothiazide (HYDRODIURIL) 25 MG tablet  Daily        05/07/21 2103    nirmatrelvir/ritonavir EUA (PAXLOVID) 20 x 150 MG & 10 x 100MG  TABS  2 times daily        05/07/21 2103    acetaminophen (TYLENOL) 325 MG tablet  Every 6 hours PRN        05/07/21 2104    methocarbamol (ROBAXIN) 500 MG tablet  2 times daily        05/07/21 2104    dextromethorphan (DELSYM) 30 MG/5ML liquid  As needed        05/07/21 2106             2107, DO 05/07/21 2109

## 2021-05-07 NOTE — ED Triage Notes (Addendum)
Pt c/o body aches, chills, cough, HA- onset this a.m.; has been out of BP medication for a while

## 2021-08-27 ENCOUNTER — Encounter (HOSPITAL_BASED_OUTPATIENT_CLINIC_OR_DEPARTMENT_OTHER): Payer: Self-pay | Admitting: *Deleted

## 2021-08-27 ENCOUNTER — Emergency Department (HOSPITAL_BASED_OUTPATIENT_CLINIC_OR_DEPARTMENT_OTHER)
Admission: EM | Admit: 2021-08-27 | Discharge: 2021-08-27 | Disposition: A | Discharge status: Home / Self Care | Payer: 59 | Attending: Emergency Medicine | Admitting: Emergency Medicine

## 2021-08-27 ENCOUNTER — Other Ambulatory Visit: Payer: Self-pay

## 2021-08-27 DIAGNOSIS — I10 Essential (primary) hypertension: Secondary | ICD-10-CM | POA: Insufficient documentation

## 2021-08-27 DIAGNOSIS — L259 Unspecified contact dermatitis, unspecified cause: Secondary | ICD-10-CM | POA: Diagnosis not present

## 2021-08-27 DIAGNOSIS — R21 Rash and other nonspecific skin eruption: Secondary | ICD-10-CM | POA: Diagnosis present

## 2021-08-27 HISTORY — DX: Essential (primary) hypertension: I10

## 2021-08-27 NOTE — ED Provider Notes (Signed)
?[facility] ?[facility] Emergency Department ?Provider Note ?MRN:  [id number]  ?Arrival date & time: 08/27/21    ? ?Chief Complaint   ?Rash ?  ?History of Present Illness   ?David Wilkerson is a 31 y.o. year-old male with no pertinent past medical history presenting to the ED with chief complaint of rash. ? ?Skin irritation and redness to the neck after shaving today.  No other complaints. ? ?Review of Systems  ?A thorough review of systems was obtained and all systems are negative except as noted in the HPI and PMH.  ? ?Patient's Health History   ? ?Past Medical History:  ?Diagnosis Date  ? Headache   ? Hypertension   ?  ?History reviewed. No pertinent surgical history.  ?Family History  ?Problem Relation Age of Onset  ? Diabetes Sister   ?  ?Social History  ? ?Socioeconomic History  ? Marital status: Single  ?  Spouse name: Not on file  ? Number of children: 0  ? Years of education: Not on file  ? Highest education level: High school graduate  ?Occupational History  ? Not on file  ?Tobacco Use  ? Smoking status: Never  ? Smokeless tobacco: Never  ?Vaping Use  ? Vaping Use: Never used  ?Substance and Sexual Activity  ? Alcohol use: No  ? Drug use: No  ? Sexual activity: Not on file  ?Other Topics Concern  ? Not on file  ?Social History Narrative  ? Lives alone  ? Caffeine- very little  ? ?Social Determinants of Health  ? ?Financial Resource Strain: Not on file  ?Food Insecurity: Not on file  ?Transportation Needs: Not on file  ?Physical Activity: Not on file  ?Stress: Not on file  ?Social Connections: Not on file  ?Intimate Partner Violence: Not on file  ?  ? ?Physical Exam  ? ?Vitals:  ? 08/27/21 2042  ?BP: (!) 168/115  ?Pulse: (!) 116  ?Resp: (!) 22  ?Temp: 99.3 ?F (37.4 ?C)  ?SpO2: 95%  ?  ?CONSTITUTIONAL: Well-appearing, NAD ?NEURO/PSYCH:  Alert and oriented x 3, no focal deficits ?EYES:  eyes equal and reactive ?ENT/NECK:  no LAD, no JVD ?CARDIO: Regular rate, well-perfused, normal S1 and  S2 ?PULM:  CTAB no wheezing or rhonchi ?GI/GU:  non-distended, non-tender ?MSK/SPINE:  No gross deformities, no edema ?SKIN:  no rash, atraumatic ? ? ?*Additional and/or pertinent findings included in MDM below ? ?Diagnostic and Interventional Summary  ? ? EKG Interpretation ? ?Date/Time:    ?Ventricular Rate:    ?PR Interval:    ?QRS Duration:   ?QT Interval:    ?QTC Calculation:   ?R Axis:     ?Text Interpretation:   ?  ? ?  ? ?Labs Reviewed - No data to display  ?No orders to display  ?  ?Medications - No data to display  ? ?Procedures  /  Critical Care ?Procedures ? ?ED Course and Medical Decision Making  ?Initial Impression and Ddx ?Well-appearing, I see no obvious rash to the neck, no signs of infection or any emergent process.  Suspect razor burn.  Appropriate for discharge with reassurance. ? ?Past medical/surgical history that increases complexity of ED encounter: None ? ?Interpretation of Diagnostics ?Not applicable ? ?Patient Reassessment and Ultimate Disposition/Management ?Discharge home ? ?Patient management required discussion with the following services or consulting groups:  None ? ?Complexity of Problems Addressed ?Acute uncomplicated illness or injury with no diagnostics ? ?Additional Data Reviewed and Analyzed ?Further history obtained from: ?  None ? ?Additional Factors Impacting ED Encounter Risk ?None ? ?Elmer Sow. Pilar Plate, MD ?[facility] Emergency Medicine ?[facility] [email or web address] ? ?Final Clinical Impressions(s) / ED Diagnoses  ? ?  ICD-10-CM   ?1. Skin irritation from shaving  L25.9   ?  ?  ?ED Discharge Orders   ? ? None  ? ?  ?  ? ?Discharge Instructions Discussed with and Provided to Patient:  ? ? ?Discharge Instructions   ? ?  ?You were evaluated in the Emergency Department and after careful evaluation, we did not find any emergent condition requiring admission or further testing in the hospital. ? ?Your exam/testing today was overall reassuring. ? ?Please return to  the Emergency Department if you experience any worsening of your condition.  Thank you for allowing [place] to be a part of your care. ? ? ? ? ?  ?Sabas Sous, MD ?08/27/21 2322 ? ?

## 2021-08-27 NOTE — Discharge Instructions (Signed)
You were evaluated in the Emergency Department and after careful evaluation, we did not find any emergent condition requiring admission or further testing in the hospital.  Your exam/testing today was overall reassuring.  Please return to the Emergency Department if you experience any worsening of your condition.  Thank you for allowing us to be a part of your care.  

## 2021-08-27 NOTE — ED Triage Notes (Signed)
Pt reports rash on his neck today after shaving ?

## 2021-08-30 ENCOUNTER — Encounter (HOSPITAL_BASED_OUTPATIENT_CLINIC_OR_DEPARTMENT_OTHER): Payer: Self-pay

## 2021-08-30 ENCOUNTER — Emergency Department (HOSPITAL_BASED_OUTPATIENT_CLINIC_OR_DEPARTMENT_OTHER)
Admission: EM | Admit: 2021-08-30 | Discharge: 2021-08-30 | Disposition: A | Discharge status: Home / Self Care | Payer: 59 | Attending: Emergency Medicine | Admitting: Emergency Medicine

## 2021-08-30 ENCOUNTER — Other Ambulatory Visit (HOSPITAL_BASED_OUTPATIENT_CLINIC_OR_DEPARTMENT_OTHER): Payer: Self-pay

## 2021-08-30 ENCOUNTER — Emergency Department (HOSPITAL_BASED_OUTPATIENT_CLINIC_OR_DEPARTMENT_OTHER): Payer: 59

## 2021-08-30 DIAGNOSIS — M542 Cervicalgia: Secondary | ICD-10-CM | POA: Diagnosis present

## 2021-08-30 DIAGNOSIS — I10 Essential (primary) hypertension: Secondary | ICD-10-CM | POA: Diagnosis not present

## 2021-08-30 DIAGNOSIS — Z79899 Other long term (current) drug therapy: Secondary | ICD-10-CM | POA: Diagnosis not present

## 2021-08-30 DIAGNOSIS — M436 Torticollis: Secondary | ICD-10-CM | POA: Diagnosis not present

## 2021-08-30 MED ORDER — IBUPROFEN 800 MG PO TABS
800.0000 mg | ORAL_TABLET | Freq: Once | ORAL | Status: AC
Start: 2021-08-30 — End: 2021-08-30
  Administered 2021-08-30: 800 mg via ORAL
  Filled 2021-08-30: qty 1

## 2021-08-30 MED ORDER — METHOCARBAMOL 500 MG PO TABS
500.0000 mg | ORAL_TABLET | Freq: Two times a day (BID) | ORAL | 0 refills | Status: DC
  Filled 2021-08-30: qty 20, 10d supply, fill #0

## 2021-08-30 NOTE — Discharge Instructions (Addendum)
Do not drive or operate machinery while taking Robaxin.  Apply warm compresses for 20 minutes at a time, gentle stretching.  Recheck with sports medicine if pain persist.  Return to ED for new or worsening symptoms.  Can continue with Motrin and Tylenol at home as well. ?

## 2021-08-30 NOTE — ED Provider Notes (Signed)
?[facility] EMERGENCY DEPARTMENT ?Provider Note ? ? ?CSN: [id number] ?Arrival date & time: 08/30/21  1208 ? ?  ? ?History ? ?Chief Complaint  ?Patient presents with  ? Neck Pain  ? ? ?David Wilkerson is a 31 y.o. male. ? ?31 year old male with past medical history of hypertension presents with complaint of pain in his neck upon waking this morning.  Patient states that he woke up with stiffness, primarily to the right posterior neck, worse with any kind of movement.  Pain is gradually improving since onset.  He denies recent falls, injury, illness.  Denies headache, sore throat, fevers, weakness or numbness.  Patient found to be hypertensive in triage, has just taken his blood pressure medication for the morning.  No other complaints or concerns today. ? ? ?  ? ?Home Medications ?Prior to Admission medications   ?Medication Sig Start Date End Date Taking? Authorizing Provider  ?methocarbamol (ROBAXIN) 500 MG tablet Take 1 tablet (500 mg total) by mouth 2 (two) times daily. 08/30/21  Yes Jeannie Fend, PA-C  ?acetaminophen (TYLENOL) 325 MG tablet Take 2 tablets (650 mg total) by mouth every 6 (six) hours as needed. 05/07/21   Sloan Leiter, DO  ?dextromethorphan (DELSYM) 30 MG/5ML liquid Take 2.5 mLs (15 mg total) by mouth as needed for cough. 05/07/21   Sloan Leiter, DO  ?hydrochlorothiazide (HYDRODIURIL) 25 MG tablet Take 1 tablet (25 mg total) by mouth daily. 12/04/20   Dione Booze, MD  ?hydrochlorothiazide (HYDRODIURIL) 25 MG tablet Take 1 tablet (25 mg total) by mouth daily. 05/07/21   Sloan Leiter, DO  ?predniSONE (DELTASONE) 50 MG tablet Take 1 tablet (50 mg total) by mouth daily. 12/04/20   Dione Booze, MD  ?fluticasone [facility]ana) 50 MCG/ACT nasal spray Place 2 sprays into both nostrils daily. ?Patient not taking: Reported on 12/02/2015 06/28/15 10/19/18  Lyndal Pulley, MD  ?SUMAtriptan (IMITREX) 100 MG tablet Take 1 tablet (100 mg total) by mouth once as needed for migraine. May repeat x 1 after  2 hours; maximum 2 tabs per day and 8 tabs per month 11/19/18 04/07/20  Penumalli, Glenford Bayley, MD  ?verapamil (VERELAN PM) 120 MG 24 hr capsule Take 1 capsule (120 mg total) by mouth at bedtime. 11/19/18 04/07/20  Penumalli, Glenford Bayley, MD  ?   ? ?Allergies    ?Patient has no known allergies.   ? ?Review of Systems   ?Review of Systems ?Negative except as per HPI ?Physical Exam ?Updated Vital Signs ?BP (!) 167/128 (BP Location: Left Arm)   Pulse 77   Temp 98.1 ?F (36.7 ?C) (Oral)   Resp 18   Ht 6' (1.829 m)   Wt 127 kg   SpO2 98%   BMI 37.97 kg/m?  ?Physical Exam ?Vitals and nursing note reviewed.  ?Constitutional:   ?   General: He is not in acute distress. ?   Appearance: He is well-developed. He is obese. He is not diaphoretic.  ?HENT:  ?   Head: Normocephalic and atraumatic.  ?   Nose: Nose normal.  ?   Mouth/Throat:  ?   Mouth: Mucous membranes are moist.  ?Eyes:  ?   Extraocular Movements: Extraocular movements intact.  ?   Pupils: Pupils are equal, round, and reactive to light.  ?Neck:  ? ?   Comments: No occipital tenderness  ?Cardiovascular:  ?   Pulses: Normal pulses.  ?Pulmonary:  ?   Effort: Pulmonary effort is normal.  ?Musculoskeletal:  ?   Cervical  back: No edema, erythema, tenderness or crepitus. Pain with movement present. No spinous process tenderness or muscular tenderness. Decreased range of motion.  ?Skin: ?   General: Skin is warm and dry.  ?   Findings: No erythema or rash.  ?Neurological:  ?   Mental Status: He is alert and oriented to person, place, and time.  ?   Sensory: No sensory deficit.  ?   Deep Tendon Reflexes: Reflexes normal.  ?Psychiatric:     ?   Behavior: Behavior normal.  ? ? ?ED Results / Procedures / Treatments   ?Labs ?(all labs ordered are listed, but only abnormal results are displayed) ?Labs Reviewed - No data to display ? ?EKG ?None ? ?Radiology ?DG Cervical Spine Complete ? ?Result Date: 08/30/2021 ?CLINICAL DATA:  Decreased mobility and neck pain since this a.m. EXAM:  CERVICAL SPINE - COMPLETE 4+ VIEW COMPARISON:  None. FINDINGS: Six cervical type vertebral bodies are seen on the lateral view. There is no evidence of cervical spine fracture or prevertebral soft tissue swelling. The dens and lateral masses appear intact. Alignment is normal. No other significant bone abnormalities are identified. IMPRESSION: Negative cervical spine radiographs. Electronically Signed   By: Maudry Mayhew M.D.   On: 08/30/2021 13:43   ? ?Procedures ?Procedures  ? ? ?Medications Ordered in ED ?Medications  ?ibuprofen (ADVIL) tablet 800 mg (800 mg Oral Given 08/30/21 1334)  ? ? ?ED Course/ Medical Decision Making/ A&P ?  ?                        ?Medical Decision Making ?Amount and/or Complexity of Data Reviewed ?Radiology: ordered. ? ?Risk ?Prescription drug management. ? ? ? ?31 year old male presents with pain in his right posterior neck upon waking without evidence of injury or recent respiratory illness.  Pain is worse with movement, has gradually been improving since onset at waking.  Pain is more notable with turning his head to the left.  He denies weakness or numbness in his arms or legs.  Differential diagnosis considered includes torticollis, fracture, herniated disc, retropharyngeal abscess, meningitis.  Doubt infectious sources no recent illness, no headache, no fever.  X-ray negative for acute bony changes.  Patient was given ibuprofen with some improvement in pain.  Plan is to continue with Motrin and Tylenol, will discharge with muscle relaxant.  Recommend warm compresses, gentle range of motion exercises.  Follow-up with sports medicine if pain persist. ? ? ? ? ? ? ? ? ? ? ?Final Clinical Impression(s) / ED Diagnoses ?Final diagnoses:  ?Torticollis, acute  ? ? ?Rx / DC Orders ?ED Discharge Orders   ? ?      Ordered  ?  methocarbamol (ROBAXIN) 500 MG tablet  2 times daily       ? 08/30/21 1349  ? ?  ?  ? ?  ? ? ?  ?Jeannie Fend, PA-C ?08/30/21 1547 ? ?  ?Milagros Loll,  MD ?08/31/21 1338 ? ?

## 2021-08-30 NOTE — ED Triage Notes (Addendum)
C/o neck stiffness after waking up this morning. Denies injury.  ? ?Hypertensive during triage, states just took his medicine pta. ?

## 2021-09-09 ENCOUNTER — Ambulatory Visit: Payer: 59 | Admitting: Family Medicine

## 2022-08-25 ENCOUNTER — Encounter: Payer: Self-pay | Admitting: *Deleted

## 2022-10-07 IMAGING — CR DG CERVICAL SPINE COMPLETE 4+V
6 series · 6 of 6 positions shown · non-contrast
Comparison: None.

CLINICAL DATA: Decreased mobility and neck pain since this a.m.

EXAM:
CERVICAL SPINE - COMPLETE 4+ VIEW

[w c-spine lat *]
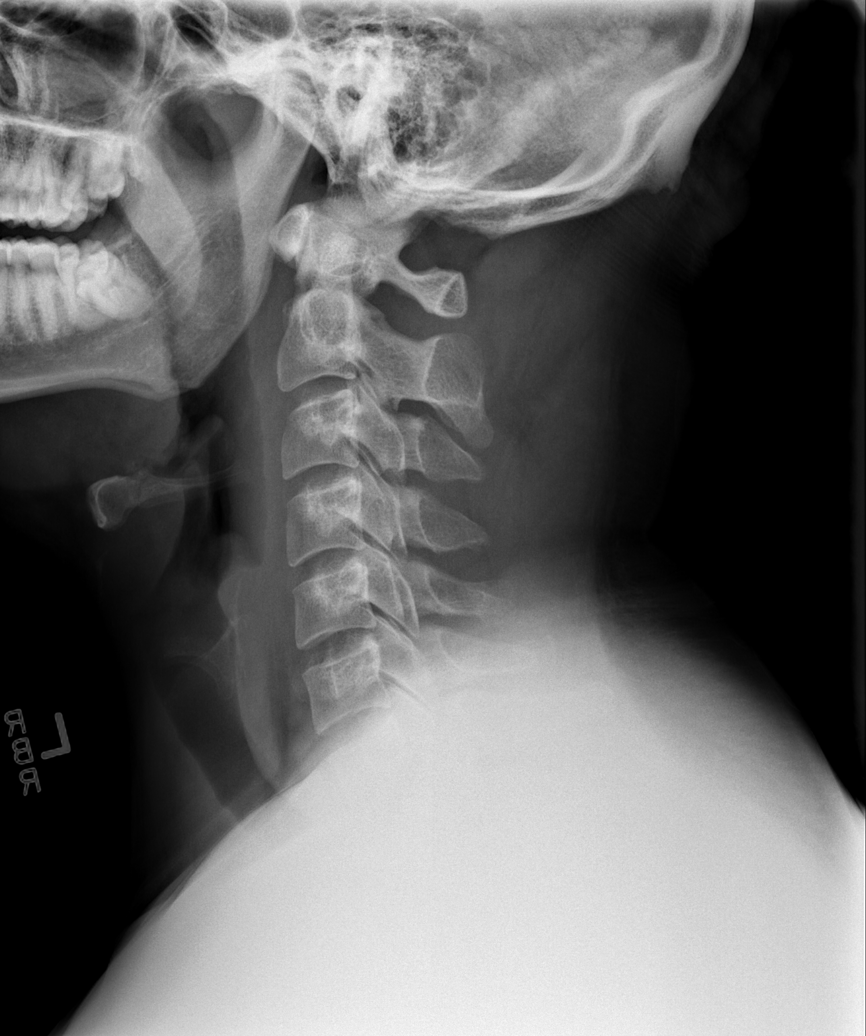

[w c-spine oblique * (1 of 2)]
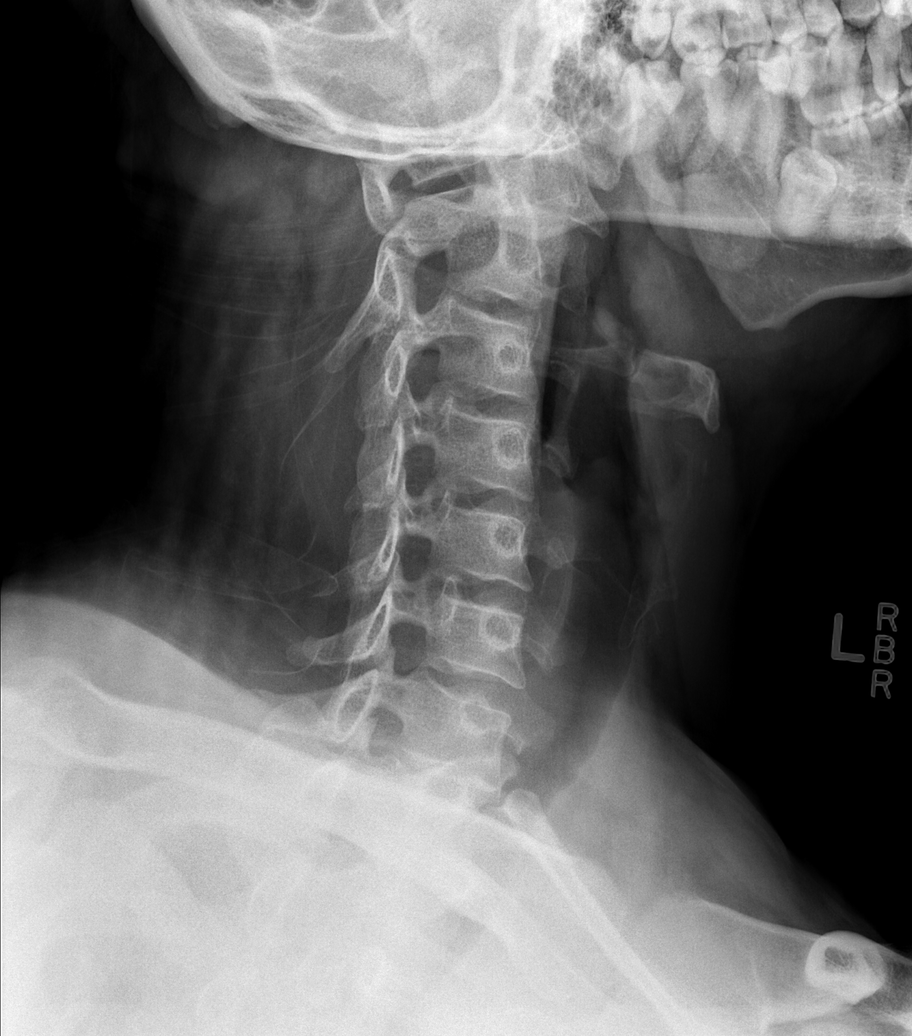

[w c-spine oblique * (2 of 2)]
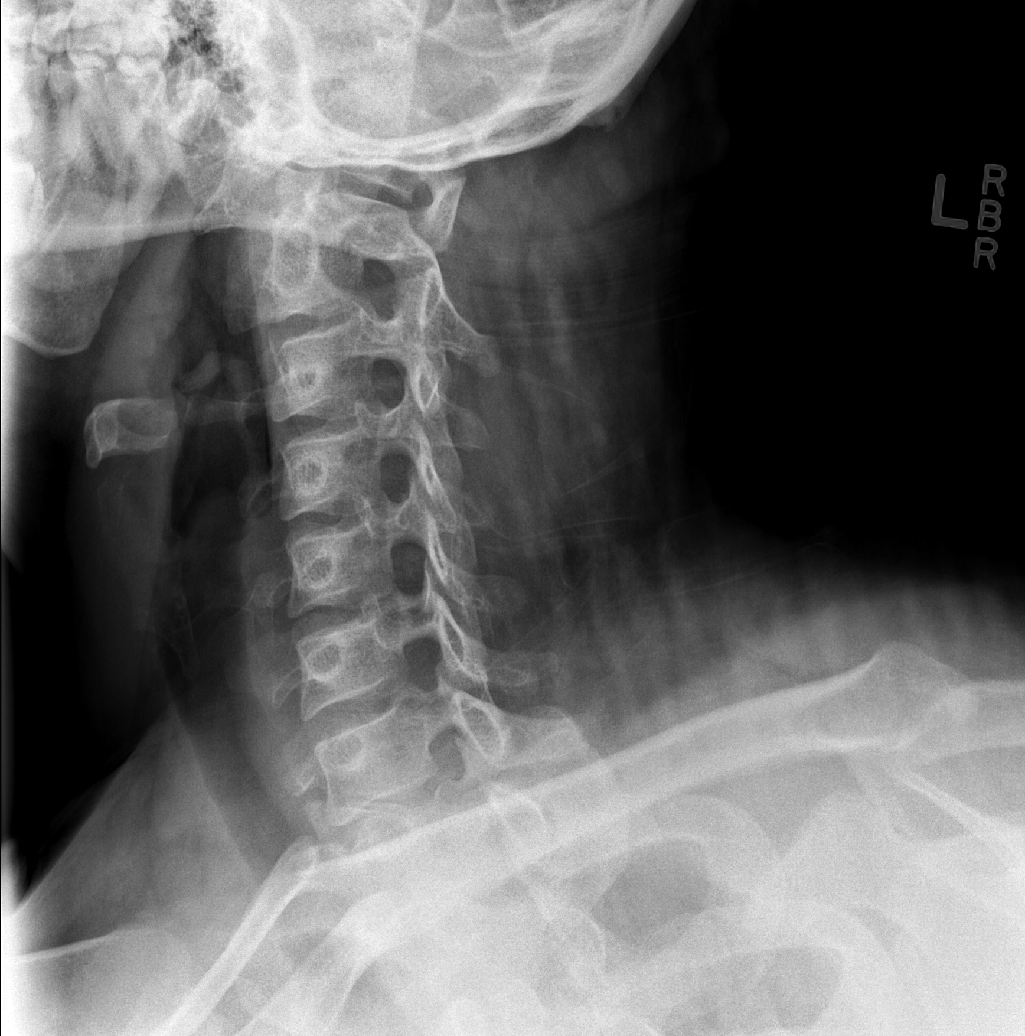

[w c-spine a.p. *]
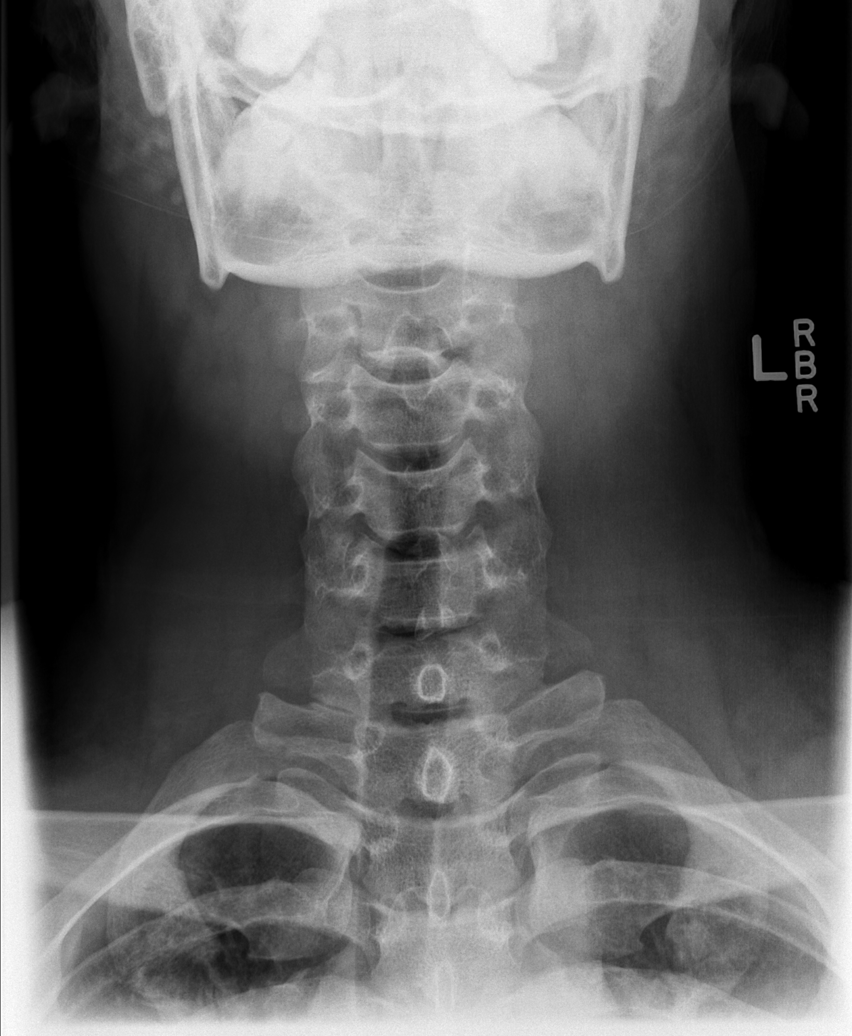

[w c-spine odontoid *]
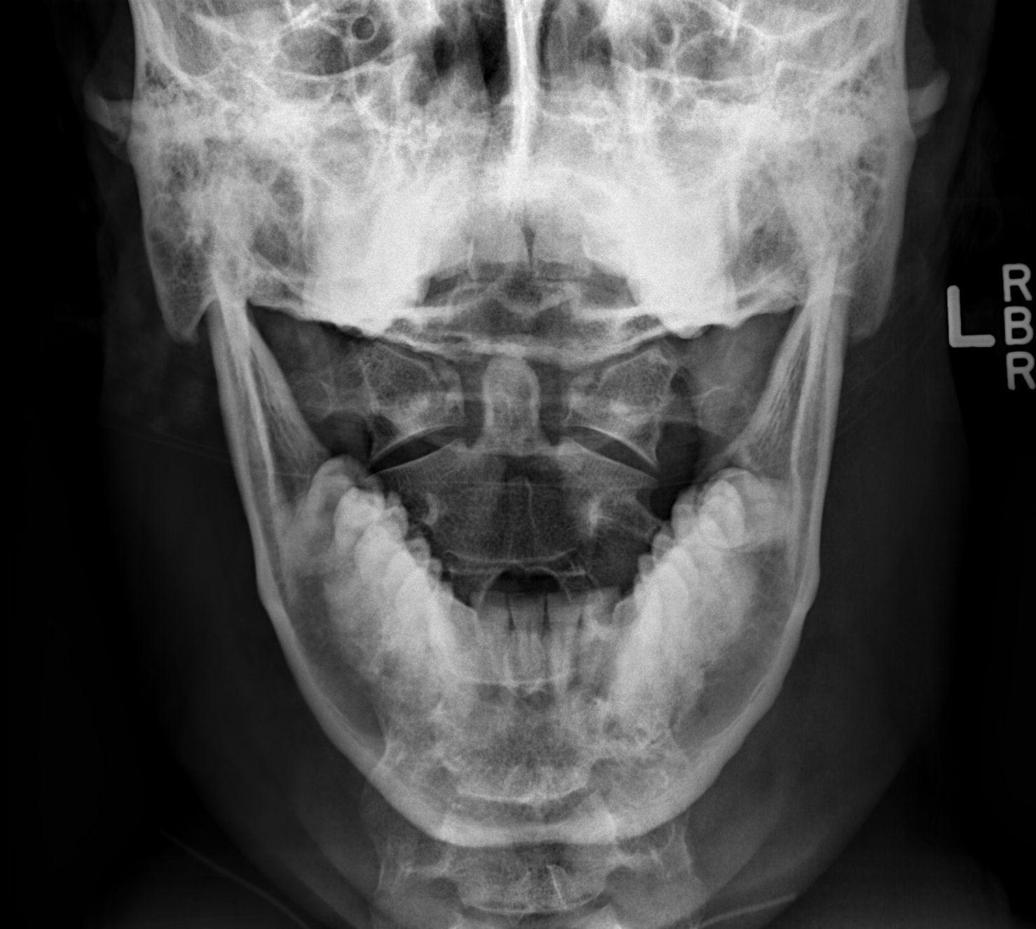

[w swimmers view *]
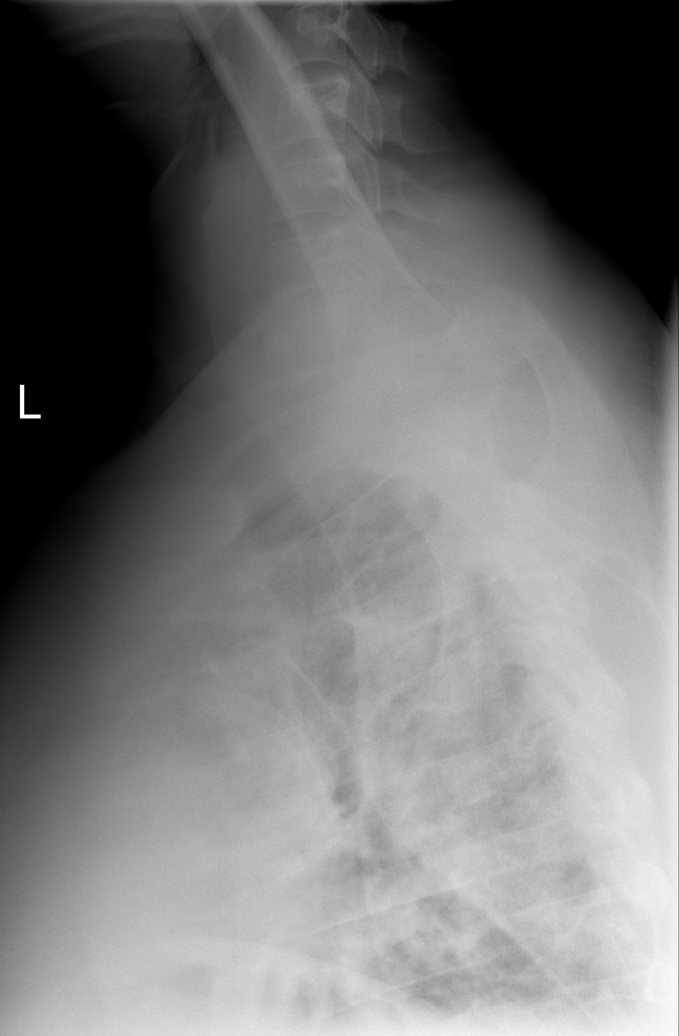

[6 of 6 positions shown; findings below may reference images not displayed]

FINDINGS: Six cervical type vertebral bodies are seen on the lateral view.
There is no evidence of cervical spine fracture or prevertebral soft
tissue swelling. The dens and lateral masses appear intact.
Alignment is normal. No other significant bone abnormalities are
identified.
IMPRESSION: Negative cervical spine radiographs.

## 2023-02-28 ENCOUNTER — Emergency Department (HOSPITAL_BASED_OUTPATIENT_CLINIC_OR_DEPARTMENT_OTHER)
Admission: EM | Admit: 2023-02-28 | Discharge: 2023-03-01 | Discharge status: Against Medical Advice | Payer: 59 | Attending: Emergency Medicine | Admitting: Emergency Medicine

## 2023-02-28 ENCOUNTER — Other Ambulatory Visit: Payer: Self-pay

## 2023-02-28 ENCOUNTER — Encounter (HOSPITAL_BASED_OUTPATIENT_CLINIC_OR_DEPARTMENT_OTHER): Payer: Self-pay | Admitting: Emergency Medicine

## 2023-02-28 DIAGNOSIS — Z5321 Procedure and treatment not carried out due to patient leaving prior to being seen by health care provider: Secondary | ICD-10-CM | POA: Insufficient documentation

## 2023-02-28 DIAGNOSIS — Z1152 Encounter for screening for COVID-19: Secondary | ICD-10-CM | POA: Diagnosis not present

## 2023-02-28 DIAGNOSIS — R61 Generalized hyperhidrosis: Secondary | ICD-10-CM | POA: Diagnosis not present

## 2023-02-28 DIAGNOSIS — R519 Headache, unspecified: Secondary | ICD-10-CM | POA: Insufficient documentation

## 2023-02-28 LAB — RESP PANEL BY RT-PCR (RSV, FLU A&B, COVID)  RVPGX2
Influenza A by PCR: NEGATIVE
Influenza B by PCR: NEGATIVE
Resp Syncytial Virus by PCR: NEGATIVE
SARS Coronavirus 2 by RT PCR: NEGATIVE

## 2023-02-28 NOTE — ED Triage Notes (Signed)
Patient reports he was at work and starting sweating. Also endorses mild headache. Concerned it is related to his high BP. 166/126 in triage. Non compliant with hydrochlorothiazide.

## 2023-03-01 NOTE — ED Notes (Signed)
This RN went into room to introduce myself but he was not in the room. Registration stated he walked out.

## 2023-03-03 ENCOUNTER — Other Ambulatory Visit: Payer: Self-pay

## 2023-03-03 ENCOUNTER — Encounter (HOSPITAL_BASED_OUTPATIENT_CLINIC_OR_DEPARTMENT_OTHER): Payer: Self-pay

## 2023-03-03 ENCOUNTER — Emergency Department (HOSPITAL_BASED_OUTPATIENT_CLINIC_OR_DEPARTMENT_OTHER): Payer: 59

## 2023-03-03 ENCOUNTER — Emergency Department (HOSPITAL_BASED_OUTPATIENT_CLINIC_OR_DEPARTMENT_OTHER)
Admission: EM | Admit: 2023-03-03 | Discharge: 2023-03-03 | Disposition: A | Discharge status: Home / Self Care | Payer: 59 | Attending: Emergency Medicine | Admitting: Emergency Medicine

## 2023-03-03 DIAGNOSIS — R0789 Other chest pain: Secondary | ICD-10-CM

## 2023-03-03 DIAGNOSIS — R519 Headache, unspecified: Secondary | ICD-10-CM

## 2023-03-03 DIAGNOSIS — Z79899 Other long term (current) drug therapy: Secondary | ICD-10-CM | POA: Insufficient documentation

## 2023-03-03 DIAGNOSIS — I10 Essential (primary) hypertension: Secondary | ICD-10-CM | POA: Insufficient documentation

## 2023-03-03 LAB — CBC WITH DIFFERENTIAL/PLATELET
Abs Immature Granulocytes: 0.02 10*3/uL (ref 0.00–0.07)
Basophils Absolute: 0 10*3/uL (ref 0.0–0.1)
Basophils Relative: 1 %
Eosinophils Absolute: 0.3 10*3/uL (ref 0.0–0.5)
Eosinophils Relative: 4 %
HCT: 47.3 % (ref 39.0–52.0)
Hemoglobin: 15.6 g/dL (ref 13.0–17.0)
Immature Granulocytes: 0 %
Lymphocytes Relative: 57 %
Lymphs Abs: 3.7 10*3/uL (ref 0.7–4.0)
MCH: 26.6 pg (ref 26.0–34.0)
MCHC: 33 g/dL (ref 30.0–36.0)
MCV: 80.7 fL (ref 80.0–100.0)
Monocytes Absolute: 0.3 10*3/uL (ref 0.1–1.0)
Monocytes Relative: 4 %
Neutro Abs: 2.2 10*3/uL (ref 1.7–7.7)
Neutrophils Relative %: 34 %
Platelets: 396 10*3/uL (ref 150–400)
RBC: 5.86 MIL/uL — ABNORMAL HIGH (ref 4.22–5.81)
RDW: 13.9 % (ref 11.5–15.5)
WBC: 6.5 10*3/uL (ref 4.0–10.5)
nRBC: 0 % (ref 0.0–0.2)

## 2023-03-03 LAB — BASIC METABOLIC PANEL
Anion gap: 12 (ref 5–15)
BUN: 11 mg/dL (ref 6–20)
CO2: 25 mmol/L (ref 22–32)
Calcium: 9 mg/dL (ref 8.9–10.3)
Chloride: 98 mmol/L (ref 98–111)
Creatinine, Ser: 1.19 mg/dL (ref 0.61–1.24)
GFR, Estimated: >60 mL/min (ref >60)
Glucose, Bld: 138 mg/dL — ABNORMAL HIGH (ref 70–99)
Potassium: 3.4 mmol/L — ABNORMAL LOW (ref 3.5–5.1)
Sodium: 135 mmol/L (ref 135–145)

## 2023-03-03 LAB — TROPONIN I (HIGH SENSITIVITY): Troponin I (High Sensitivity): 5 ng/L (ref <18)

## 2023-03-03 MED ORDER — POTASSIUM CHLORIDE CRYS ER 20 MEQ PO TBCR
40.0000 meq | EXTENDED_RELEASE_TABLET | Freq: Once | ORAL | Status: AC
  Administered 2023-03-03: 40 meq via ORAL
  Filled 2023-03-03: qty 2

## 2023-03-03 NOTE — ED Triage Notes (Signed)
Pt states he has elevated BP for 2 days. Complaints of on and off chest pain, headaches, and SHOB.   States became to ED 2 days ago for same issue after "feeling over heated at work" but left before being seen

## 2023-03-03 NOTE — ED Provider Notes (Signed)
[place] [facility] AT [facility] Provider Note   CSN: [id number] Arrival date & time: 03/03/23  7829     History  Chief Complaint  Patient presents with   Chest Pain    David Wilkerson is a 32 y.o. male.  The history is provided by the patient and medical records.  Chest Pain David Wilkerson is a 32 y.o. male who presents to the Emergency Department complaining of chest pain and headache.  He presents to the emergency department for evaluation of intermittent episodes of chest pain and headache that have been ongoing for the last 2 days.  He describes them as a very brief, fleeting feeling in his chest as well as his head.  The headache is frontal and goes from side-to-side.  Chest is central.  No associated fevers, nausea, abdominal pain, vision changes, numbness, weakness, leg swelling or pain.  He does feel short of breath at times but not consistently.  His episodes only last for about a second and they occur intermittently with no clear alleviating or worsening factors.  He may have 10 minutes to several hours between episodes.  He has a history of hypertension and was on HCTZ but stopped taking it about 1 month ago due to weight loss.  He restarted his medication yesterday when he noticed that his blood pressure continued to be high.  He has a list of blood pressures from home that have been taken about once a month since June and his blood pressures range from 139 systolic to 170 systolic.  He has a history of hypertension, no additional significant medical history.  No tobacco, alcohol, drug use.     Home Medications Prior to Admission medications   Medication Sig Start Date End Date Taking? Authorizing Provider  acetaminophen (TYLENOL) 325 MG tablet Take 2 tablets (650 mg total) by mouth every 6 (six) hours as needed. 05/07/21   Sloan Leiter, DO  dextromethorphan (DELSYM) 30 MG/5ML liquid Take 2.5 mLs (15 mg total) by mouth as needed for cough.  05/07/21   Sloan Leiter, DO  hydrochlorothiazide (HYDRODIURIL) 25 MG tablet Take 1 tablet (25 mg total) by mouth daily. 12/04/20   Dione Booze, MD  hydrochlorothiazide (HYDRODIURIL) 25 MG tablet Take 1 tablet (25 mg total) by mouth daily. 05/07/21   Sloan Leiter, DO  methocarbamol (ROBAXIN) 500 MG tablet Take 1 tablet (500 mg total) by mouth 2 (two) times daily. 08/30/21   Jeannie Fend, PA-C  predniSONE (DELTASONE) 50 MG tablet Take 1 tablet (50 mg total) by mouth daily. 12/04/20   Dione Booze, MD  fluticasone [facility]) 50 MCG/ACT nasal spray Place 2 sprays into both nostrils daily. Patient not taking: Reported on 12/02/2015 06/28/15 10/19/18  Lyndal Pulley, MD  SUMAtriptan (IMITREX) 100 MG tablet Take 1 tablet (100 mg total) by mouth once as needed for migraine. May repeat x 1 after 2 hours; maximum 2 tabs per day and 8 tabs per month 11/19/18 04/07/20  Penumalli, Glenford Bayley, MD  verapamil (VERELAN PM) 120 MG 24 hr capsule Take 1 capsule (120 mg total) by mouth at bedtime. 11/19/18 04/07/20  Penumalli, Glenford Bayley, MD      Allergies    Patient has no known allergies.    Review of Systems   Review of Systems  Cardiovascular:  Positive for chest pain.  All other systems reviewed and are negative.   Physical Exam Updated Vital Signs BP (!) 134/94   Pulse 74   Temp 97.9  F (36.6 C) (Oral)   Resp 20   Ht 5\' 11"  (1.803 m)   Wt (!) 149.2 kg   SpO2 97%   BMI 45.89 kg/m  Physical Exam Vitals and nursing note reviewed.  Constitutional:      Appearance: He is well-developed.  HENT:     Head: Normocephalic and atraumatic.  Cardiovascular:     Rate and Rhythm: Normal rate and regular rhythm.     Heart sounds: No murmur heard. Pulmonary:     Effort: Pulmonary effort is normal. No respiratory distress.     Breath sounds: Normal breath sounds.  Abdominal:     Palpations: Abdomen is soft.     Tenderness: There is no abdominal tenderness. There is no guarding or rebound.  Musculoskeletal:         General: No swelling or tenderness.  Skin:    General: Skin is warm and dry.  Neurological:     Mental Status: He is alert and oriented to person, place, and time.     Comments: No asymmetry of facial movements.  5 out of 5 strength in all 4 extremities with sensation to light touch intact in all 4 extremities.  Visual fields grossly intact.  Psychiatric:        Behavior: Behavior normal.     ED Results / Procedures / Treatments   Labs (all labs ordered are listed, but only abnormal results are displayed) Labs Reviewed  BASIC METABOLIC PANEL - Abnormal; Notable for the following components:      Result Value   Potassium 3.4 (*)    Glucose, Bld 138 (*)    All other components within normal limits  CBC WITH DIFFERENTIAL/PLATELET - Abnormal; Notable for the following components:   RBC 5.86 (*)    All other components within normal limits  TROPONIN I (HIGH SENSITIVITY)    EKG EKG Interpretation Date/Time:  Friday March 03 2023 06:21:09 EDT Ventricular Rate:  76 PR Interval:  202 QRS Duration:  93 QT Interval:  368 QTC Calculation: 414 R Axis:   15  Text Interpretation: Sinus rhythm Borderline prolonged PR interval Confirmed by Tilden Fossa [phone]) on 03/03/2023 6:24:26 AM  Radiology No results found.  Procedures Procedures    Medications Ordered in ED Medications  potassium chloride SA (KLOR-CON M) CR tablet 40 mEq (40 mEq Oral Given 03/03/23 6045)    ED Course/ Medical Decision Making/ A&P                                 Medical Decision Making Amount and/or Complexity of Data Reviewed Labs: ordered. Radiology: ordered.  Risk Prescription drug management.   Patient here for evaluation of very brief episodes of chest pain, headache over the last few days.  He does have a history of hypertension.  He is asymptomatic at time of ED arrival.  EKG is without acute ischemic changes.  He has no focal deficits on examination.  Blood pressures are  elevated, but have been elevated for several months.  Current clinical picture is not consistent with CVA, hypertensive urgency, ACS, PE, dissection.  He is PERC negative.  BMP with mild hypokalemia-will replace orally.  No evidence of hypertensive crisis.  Plan to discharge home with ongoing blood pressure meds, as needed meds for pain with outpatient follow-up and return precautions.        Final Clinical Impression(s) / ED Diagnoses Final diagnoses:  Atypical chest pain  Mild headache    Rx / DC Orders ED Discharge Orders     None         Tilden Fossa, MD 03/03/23 [phone]
